# Patient Record
Sex: Female | Born: 1960 | Race: White | Hispanic: No | Marital: Married | Smoking: Never smoker
Health system: Southern US, Community
[De-identification: ages and names within clinical notes are randomized; demographics above are authoritative.]

## PROBLEM LIST (undated history)

## (undated) DIAGNOSIS — E669 Obesity, unspecified: Secondary | ICD-10-CM

## (undated) DIAGNOSIS — Z91030 Bee allergy status: Secondary | ICD-10-CM

## (undated) DIAGNOSIS — M1990 Unspecified osteoarthritis, unspecified site: Secondary | ICD-10-CM

## (undated) DIAGNOSIS — E039 Hypothyroidism, unspecified: Secondary | ICD-10-CM

## (undated) DIAGNOSIS — E559 Vitamin D deficiency, unspecified: Secondary | ICD-10-CM

## (undated) DIAGNOSIS — E0590 Thyrotoxicosis, unspecified without thyrotoxic crisis or storm: Secondary | ICD-10-CM

## (undated) HISTORY — DX: Thyrotoxicosis, unspecified without thyrotoxic crisis or storm: E05.90

## (undated) HISTORY — PX: OTHER SURGICAL HISTORY: SHX169

## (undated) HISTORY — DX: Unspecified osteoarthritis, unspecified site: M19.90

## (undated) HISTORY — DX: Bee allergy status: Z91.030

## (undated) HISTORY — DX: Vitamin D deficiency, unspecified: E55.9

## (undated) HISTORY — DX: Obesity, unspecified: E66.9

---

## 2003-01-08 ENCOUNTER — Encounter: Payer: Self-pay | Admitting: Family Medicine

## 2003-01-08 ENCOUNTER — Encounter: Admission: RE | Admit: 2003-01-08 | Discharge: 2003-01-08 | Payer: Self-pay | Admitting: Family Medicine

## 2003-02-03 ENCOUNTER — Other Ambulatory Visit: Admission: RE | Admit: 2003-02-03 | Discharge: 2003-02-03 | Payer: Self-pay | Admitting: Family Medicine

## 2004-01-25 ENCOUNTER — Encounter: Admission: RE | Admit: 2004-01-25 | Discharge: 2004-01-25 | Payer: Self-pay | Admitting: Family Medicine

## 2004-03-24 ENCOUNTER — Ambulatory Visit (HOSPITAL_COMMUNITY): Admission: RE | Admit: 2004-03-24 | Discharge: 2004-03-24 | Payer: Self-pay | Admitting: Gastroenterology

## 2004-03-24 HISTORY — PX: COLONOSCOPY: SHX174

## 2004-03-24 LAB — HM COLONOSCOPY: HM Colonoscopy: NORMAL

## 2008-04-20 ENCOUNTER — Emergency Department (HOSPITAL_COMMUNITY): Admission: EM | Admit: 2008-04-20 | Discharge: 2008-04-20 | Payer: Self-pay | Admitting: Emergency Medicine

## 2008-04-20 DIAGNOSIS — Z91030 Bee allergy status: Secondary | ICD-10-CM | POA: Insufficient documentation

## 2008-04-20 HISTORY — DX: Bee allergy status: Z91.030

## 2008-08-10 ENCOUNTER — Encounter: Admission: RE | Admit: 2008-08-10 | Discharge: 2008-08-10 | Payer: Self-pay | Admitting: Physician Assistant

## 2010-02-15 LAB — HM PAP SMEAR: HM Pap smear: NORMAL

## 2010-02-22 ENCOUNTER — Encounter: Admission: RE | Admit: 2010-02-22 | Discharge: 2010-02-22 | Payer: Self-pay | Admitting: Family Medicine

## 2010-02-22 LAB — HM MAMMOGRAPHY: HM Mammogram: NORMAL

## 2010-10-09 ENCOUNTER — Encounter: Payer: Self-pay | Admitting: Family Medicine

## 2011-02-03 NOTE — Op Note (Signed)
NAME:  Raven Mendoza, Raven Mendoza                          ACCOUNT NO.:  [id number]   MEDICAL RECORD NO.:  [id number]                   PATIENT TYPE:  AMB   LOCATION:  ENDO                                 FACILITY:  [facility]   PHYSICIAN:  Danise Edge, M.D.                DATE OF BIRTH:  26-Apr-1961   DATE OF PROCEDURE:  03/24/2004  DATE OF DISCHARGE:                                 OPERATIVE REPORT   PROCEDURE:  Screening colonoscopy.   PROCEDURE INDICATION:  [name] is a 50 year old female, born 1960-09-30.  Ms. Schultes's 78 year old brother required surgery for colon  cancer.  She is scheduled to undergo her first screening colonoscopy with  polypectomy to prevent colon cancer.   ENDOSCOPIST:  Danise Edge, M.D.   PREMEDICATION:  1. Versed 10 mg.  2. Demerol 75 mg.   DESCRIPTION OF PROCEDURE:  After obtaining informed consent, [name] was  placed in the left lateral decubitus position.  I administered intravenous  Demerol and intravenous Versed to achieve conscious sedation for the  procedure.  The patient's blood pressure, oxygen saturation, and cardiac  rhythm were monitored throughout the procedure and documented in the medical  record.   Anal inspection and digital rectal exam were normal.  The Olympus adjustable  pediatric colonoscope was introduced into the rectum and advanced to the  cecum.  Colonic preparation for the exam today was excellent.   RECTUM:  Normal.  SIGMOID COLON AND DESCENDING COLON:  Normal.  SPLENIC FLEXURE:  Normal.  TRANSVERSE COLON:  Normal.  HEPATIC FLEXURE:  Normal.  ASCENDING COLON:  Normal.  CECUM AND ILEOCECAL VALVE:  Normal.  DISTAL ILEUM:  Normal.   ASSESSMENT:  Normal screening proctocolonoscopy.   RECOMMENDATIONS:  Repeat colonoscopy in 5 years.                                               Danise Edge, M.D.    MJ/MEDQ  D:  03/24/2004  T:  03/24/2004  Job:  [id number]   cc:   Clydie Braun L. Hal Hope, M.D.  [address]  [address] [place]  [place] [address]  Fax: [phone]

## 2011-10-09 ENCOUNTER — Encounter (HOSPITAL_COMMUNITY): Payer: Self-pay | Admitting: Pharmacy Technician

## 2011-10-18 ENCOUNTER — Encounter (HOSPITAL_COMMUNITY)
Admission: RE | Admit: 2011-10-18 | Discharge: 2011-10-18 | Disposition: A | Discharge status: Home / Self Care | Payer: No Typology Code available for payment source | Source: Ambulatory Visit | Attending: Orthopedic Surgery | Admitting: Orthopedic Surgery

## 2011-10-18 ENCOUNTER — Encounter (HOSPITAL_COMMUNITY): Payer: Self-pay

## 2011-10-18 HISTORY — DX: Hypothyroidism, unspecified: E03.9

## 2011-10-18 HISTORY — DX: Unspecified osteoarthritis, unspecified site: M19.90

## 2011-10-18 LAB — HEPATIC FUNCTION PANEL
ALT: 16 U/L (ref 0–35)
Alkaline Phosphatase: 78 U/L (ref 39–117)
Bilirubin, Direct: <0.1 mg/dL (ref 0.0–0.3)
Total Protein: 7.9 g/dL (ref 6.0–8.3)

## 2011-10-18 LAB — BASIC METABOLIC PANEL
BUN: 18 mg/dL (ref 6–23)
Creatinine, Ser: 0.64 mg/dL (ref 0.50–1.10)
GFR calc Af Amer: >90 mL/min (ref >90)
GFR calc non Af Amer: >90 mL/min (ref >90)
Glucose, Bld: 96 mg/dL (ref 70–99)
Potassium: 3.4 mEq/L — ABNORMAL LOW (ref 3.5–5.1)

## 2011-10-18 LAB — CBC
HCT: 40 % (ref 36.0–46.0)
Hemoglobin: 13.8 g/dL (ref 12.0–15.0)
MCH: 28.8 pg (ref 26.0–34.0)
MCHC: 34.5 g/dL (ref 30.0–36.0)
RDW: 13.2 % (ref 11.5–15.5)

## 2011-10-18 LAB — DIFFERENTIAL
Basophils Absolute: 0.1 10*3/uL (ref 0.0–0.1)
Basophils Relative: 1 % (ref 0–1)
Eosinophils Absolute: 0.1 10*3/uL (ref 0.0–0.7)
Monocytes Absolute: 0.6 10*3/uL (ref 0.1–1.0)
Monocytes Relative: 10 % (ref 3–12)
Neutro Abs: 4 10*3/uL (ref 1.7–7.7)
Neutrophils Relative %: 62 % (ref 43–77)

## 2011-10-18 LAB — URINALYSIS, ROUTINE W REFLEX MICROSCOPIC
Bilirubin Urine: NEGATIVE
Glucose, UA: NEGATIVE mg/dL
Ketones, ur: NEGATIVE mg/dL
Protein, ur: NEGATIVE mg/dL
pH: 5.5 (ref 5.0–8.0)

## 2011-10-18 NOTE — Patient Instructions (Addendum)
[address] Raven Mendoza  10/18/2011   Your procedure is scheduled on:  10-24-2011 Report to [facility] at 1130 AM.  Call this number if you have problems the morning of surgery: [phone]   Remember:   Do not eat food:After Midnight.  May have clear liquids:up to 6 Hours before arrival.clear liquids midnight until 745 am day of surgery, then nothing  Clear liquids include soda, tea, black coffee, apple or grape juice, broth.  Take these medicines the morning of surgery with A SIP OF WATER: restasis eye drop, synthroid, systane eye drop    Do not wear jewelry, make-up.  Do not wear lotions, powders, or perfumes. Do not wear deodorant.  Do not shave 48 hours prior to surgery.  Do not bring valuables to the hospital.  Contacts, dentures or bridgework may not be worn into surgery.  Leave suitcase in the car. After surgery it may be brought to your room.  For patients admitted to the hospital, checkout time is 11:00 AM the day of discharge.    Special Instructions: CHG Shower Use Special Wash: 1/2 bottle night before surgery and 1/2 bottle morning of surgery.neck down avoid private area   Please read over the following fact sheets that you were given: MRSA Information, blood fact sheet [name] [name]  rn [facility] pre op nurse [phone] phone number

## 2011-10-18 NOTE — Progress Notes (Signed)
H&P performed 10/18/2011 Dictation # [id number]

## 2011-10-18 NOTE — Pre-Procedure Instructions (Signed)
ekg 09-13-2011 and medical clearance note dr Shon Hale dixon on chart

## 2011-10-19 NOTE — H&P (Signed)
NAME:  Raven Mendoza, Raven Mendoza                ACCOUNT NO.:  [id number]  MEDICAL RECORD NO.:  [id number]  LOCATION:  PADM                         FACILITY:  [facility]  PHYSICIAN:  Irl Bodie J. Alauna Hayden, P.A.DATE OF BIRTH:  01/12/1961  DATE OF ADMISSION:  10/18/2011 DATE OF DISCHARGE:  10/18/2011                             HISTORY & PHYSICAL   ANTICIPATED DATE OF SURGERY:  October 24, 2011  ADMITTING DIAGNOSIS:  Right hip osteoarthritis.  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  After discussion of treatments, benefits, risks, and options, the patient is now scheduled for total hip arthroplasty of the right hip by anterior approach.  Note that the patient is planning on going home after surgery.  Her medical doctor is Kena Beth Dixon.  She is a candidate for tranexamic acid and dexamethasone and will receive that at preop and she is given her home medicines of aspirin, iron, MiraLax, Colace, and Robaxin.  ALLERGIES:  Drug allergy to PENICILLIN with swelling and rash.  CURRENT MEDICATIONS: 1. Meloxicam 15 mg once a day. 2. Synthroid 125 mcg once a day.  MEDICAL ILLNESSES:  History of hyperthyroidism with radiation therapy and now subsequent hypothyroidism.  PREVIOUS SURGERIES:  C-section x2.  FAMILY HISTORY:  Positive for cancer.  SOCIAL HISTORY:  The patient is married.  She is a dairy farmer.  She does not smoke and does not drink and again is planning on going home after surgery.  REVIEW OF SYSTEMS:  CENTRAL NERVOUS SYSTEM:  Negative for headache, blurred vision, or dizziness.  PULMONARY:  Negative for shortness of breath, PND, and orthopnea.  CARDIOVASCULAR:  No chest pain or palpitation.  GASTROINTESTINAL:  Negative for ulcers and hepatitis. GENITOURINARY:  Negative for urinary tract difficulty.  MUSCULOSKELETAL: Positive in HPI.  PHYSICAL EXAMINATION:  VITAL SIGNS:   Blood pressure 128/73, pulse 76 and regular, respirations 14.  HEENT:  Head, normocephalic.  Nose, patent. Ears, patent.  Pupils equal, round, reactive to light.  Throat without injection. NECK:  Supple without adenopathy.  Carotids 2+ without bruit. CHEST:  Clear auscultation.  No rales or rhonchi.  Respirations 14. HEART:  Regular rate and rhythm at 76 beats per minute with a 1/6 systolic ejection murmur. ABDOMEN:  Soft with active bowel sounds.  No masses or organomegaly. NEUROLOGIC:  The patient is alert and oriented to time, place, and person.  Cranial nerves II through XII grossly intact. EXTREMITIES:  The right hip with limited range of motion with 5 degrees from full extension, further flexion 95 degrees, external rotation of 15 degrees, internal rotation to neutral. NEUROVASCULAR:  Intact.  IMPRESSION:  End-stage osteoarthritis of right hip.  PLAN:  Total hip arthroplasty by anterior approach.     Jazlynne Milliner J. Nickolaos Brallier, P.A.     SJC/MEDQ  D:  10/18/2011  T:  10/19/2011  Job:  [id number] 

## 2011-10-24 ENCOUNTER — Encounter (HOSPITAL_COMMUNITY): Payer: Self-pay | Admitting: *Deleted

## 2011-10-24 ENCOUNTER — Inpatient Hospital Stay (HOSPITAL_COMMUNITY): Payer: No Typology Code available for payment source | Admitting: Anesthesiology

## 2011-10-24 ENCOUNTER — Encounter (HOSPITAL_COMMUNITY)
Admission: RE | Disposition: A | Discharge status: Home Health | Payer: Self-pay | Source: Ambulatory Visit | Attending: Orthopedic Surgery

## 2011-10-24 ENCOUNTER — Inpatient Hospital Stay (HOSPITAL_COMMUNITY): Payer: No Typology Code available for payment source

## 2011-10-24 ENCOUNTER — Encounter (HOSPITAL_COMMUNITY): Payer: Self-pay | Admitting: Anesthesiology

## 2011-10-24 ENCOUNTER — Inpatient Hospital Stay (HOSPITAL_COMMUNITY)
Admission: RE | Admit: 2011-10-24 | Discharge: 2011-10-26 | DRG: 470 | Disposition: A | Discharge status: Home Health | Payer: No Typology Code available for payment source | Source: Ambulatory Visit | Attending: Orthopedic Surgery | Admitting: Orthopedic Surgery

## 2011-10-24 DIAGNOSIS — E039 Hypothyroidism, unspecified: Secondary | ICD-10-CM | POA: Diagnosis present

## 2011-10-24 DIAGNOSIS — M169 Osteoarthritis of hip, unspecified: Principal | ICD-10-CM | POA: Diagnosis present

## 2011-10-24 DIAGNOSIS — Z01812 Encounter for preprocedural laboratory examination: Secondary | ICD-10-CM

## 2011-10-24 DIAGNOSIS — Z96649 Presence of unspecified artificial hip joint: Secondary | ICD-10-CM

## 2011-10-24 DIAGNOSIS — M1610 Unilateral primary osteoarthritis, unspecified hip: Principal | ICD-10-CM | POA: Diagnosis present

## 2011-10-24 HISTORY — PX: TOTAL HIP ARTHROPLASTY: SHX124

## 2011-10-24 LAB — TYPE AND SCREEN: ABO/RH(D): O POS

## 2011-10-24 SURGERY — ARTHROPLASTY, HIP, TOTAL, ANTERIOR APPROACH
Anesthesia: General | Site: Hip | Laterality: Right | Wound class: Clean

## 2011-10-24 MED ORDER — PROMETHAZINE HCL 25 MG/ML IJ SOLN: 6.2500 mg | INTRAMUSCULAR | Status: DC | PRN

## 2011-10-24 MED ORDER — LEVOTHYROXINE SODIUM 125 MCG PO TABS
125.0000 ug | ORAL_TABLET | Freq: Every day | ORAL | Status: DC
  Administered 2011-10-25 – 2011-10-26 (×2): 125 ug via ORAL
  Filled 2011-10-24 (×3): qty 1

## 2011-10-24 MED ORDER — MENTHOL 3 MG MT LOZG: 1.0000 | LOZENGE | OROMUCOSAL | Status: DC | PRN

## 2011-10-24 MED ORDER — FLEET ENEMA 7-19 GM/118ML RE ENEM: 1.0000 | ENEMA | Freq: Once | RECTAL | Status: AC | PRN

## 2011-10-24 MED ORDER — POLYETHYLENE GLYCOL 3350 17 G PO PACK
17.0000 g | PACK | Freq: Two times a day (BID) | ORAL | Status: DC
  Administered 2011-10-25 – 2011-10-26 (×3): 17 g via ORAL
  Filled 2011-10-24 (×5): qty 1

## 2011-10-24 MED ORDER — CYCLOSPORINE 0.05 % OP EMUL
1.0000 [drp] | Freq: Two times a day (BID) | OPHTHALMIC | Status: DC
  Administered 2011-10-25 (×2): 1 [drp] via OPHTHALMIC
  Filled 2011-10-24 (×5): qty 1

## 2011-10-24 MED ORDER — CLINDAMYCIN PHOSPHATE 600 MG/50ML IV SOLN
600.0000 mg | INTRAVENOUS | Status: AC
  Administered 2011-10-24: 600 mg via INTRAVENOUS

## 2011-10-24 MED ORDER — HYDROMORPHONE HCL PF 1 MG/ML IJ SOLN
0.5000 mg | INTRAMUSCULAR | Status: DC | PRN
  Administered 2011-10-25: 0.5 mg via INTRAVENOUS
  Filled 2011-10-24: qty 1

## 2011-10-24 MED ORDER — FERROUS SULFATE 325 (65 FE) MG PO TABS
325.0000 mg | ORAL_TABLET | Freq: Three times a day (TID) | ORAL | Status: DC
  Administered 2011-10-25 – 2011-10-26 (×5): 325 mg via ORAL
  Filled 2011-10-24 (×7): qty 1

## 2011-10-24 MED ORDER — FENTANYL CITRATE 0.05 MG/ML IJ SOLN
INTRAMUSCULAR | Status: DC | PRN
  Administered 2011-10-24 (×5): 50 ug via INTRAVENOUS

## 2011-10-24 MED ORDER — PROPOFOL 10 MG/ML IV EMUL
INTRAVENOUS | Status: DC | PRN
  Administered 2011-10-24: 175 mg via INTRAVENOUS

## 2011-10-24 MED ORDER — PHENOL 1.4 % MT LIQD: 1.0000 | OROMUCOSAL | Status: DC | PRN

## 2011-10-24 MED ORDER — GLYCOPYRROLATE 0.2 MG/ML IJ SOLN
INTRAMUSCULAR | Status: DC | PRN
  Administered 2011-10-24: .4 mg via INTRAVENOUS

## 2011-10-24 MED ORDER — SUCCINYLCHOLINE CHLORIDE 20 MG/ML IJ SOLN
INTRAMUSCULAR | Status: DC | PRN
  Administered 2011-10-24: 100 mg via INTRAVENOUS

## 2011-10-24 MED ORDER — 0.9 % SODIUM CHLORIDE (POUR BTL) OPTIME
TOPICAL | Status: DC | PRN
  Administered 2011-10-24: 1000 mL

## 2011-10-24 MED ORDER — METOCLOPRAMIDE HCL 10 MG PO TABS: 5.0000 mg | ORAL_TABLET | Freq: Three times a day (TID) | ORAL | Status: DC | PRN

## 2011-10-24 MED ORDER — METHOCARBAMOL 500 MG PO TABS
500.0000 mg | ORAL_TABLET | Freq: Four times a day (QID) | ORAL | Status: DC | PRN
  Administered 2011-10-25 – 2011-10-26 (×5): 500 mg via ORAL
  Filled 2011-10-24 (×5): qty 1

## 2011-10-24 MED ORDER — METOCLOPRAMIDE HCL 5 MG/ML IJ SOLN: 5.0000 mg | Freq: Three times a day (TID) | INTRAMUSCULAR | Status: DC | PRN

## 2011-10-24 MED ORDER — TRANEXAMIC ACID 100 MG/ML IV SOLN
1250.0000 mg | INTRAVENOUS | Status: AC
  Administered 2011-10-24: 1250 mg via INTRAVENOUS
  Filled 2011-10-24: qty 12.5

## 2011-10-24 MED ORDER — DROPERIDOL 2.5 MG/ML IJ SOLN
INTRAMUSCULAR | Status: DC | PRN
  Administered 2011-10-24: .5 mg via INTRAVENOUS

## 2011-10-24 MED ORDER — ACETAMINOPHEN 650 MG RE SUPP: 650.0000 mg | Freq: Four times a day (QID) | RECTAL | Status: DC | PRN

## 2011-10-24 MED ORDER — CISATRACURIUM BESYLATE 2 MG/ML IV SOLN
INTRAVENOUS | Status: DC | PRN
  Administered 2011-10-24: 4 mg via INTRAVENOUS
  Administered 2011-10-24: 8 mg via INTRAVENOUS

## 2011-10-24 MED ORDER — ACETAMINOPHEN 10 MG/ML IV SOLN
INTRAVENOUS | Status: DC | PRN
  Administered 2011-10-24: 1000 mg via INTRAVENOUS

## 2011-10-24 MED ORDER — NEOSTIGMINE METHYLSULFATE 1 MG/ML IJ SOLN
INTRAMUSCULAR | Status: DC | PRN
  Administered 2011-10-24: 3 mg via INTRAVENOUS

## 2011-10-24 MED ORDER — METHOCARBAMOL 100 MG/ML IJ SOLN
500.0000 mg | Freq: Four times a day (QID) | INTRAVENOUS | Status: DC | PRN
  Administered 2011-10-24 – 2011-10-25 (×2): 500 mg via INTRAVENOUS
  Filled 2011-10-24 (×3): qty 5

## 2011-10-24 MED ORDER — VITAMIN D3 25 MCG (1000 UNIT) PO TABS
2000.0000 [IU] | ORAL_TABLET | Freq: Every day | ORAL | Status: DC
  Administered 2011-10-25 – 2011-10-26 (×2): 2000 [IU] via ORAL
  Filled 2011-10-24 (×3): qty 2

## 2011-10-24 MED ORDER — ACETAMINOPHEN 325 MG PO TABS: 650.0000 mg | ORAL_TABLET | Freq: Four times a day (QID) | ORAL | Status: DC | PRN

## 2011-10-24 MED ORDER — POLYETHYL GLYCOL-PROPYL GLYCOL 0.4-0.3 % OP SOLN: 1.0000 [drp] | Freq: Two times a day (BID) | OPHTHALMIC | Status: DC | PRN

## 2011-10-24 MED ORDER — MIDAZOLAM HCL 5 MG/5ML IJ SOLN
INTRAMUSCULAR | Status: DC | PRN
  Administered 2011-10-24 (×2): 1 mg via INTRAVENOUS

## 2011-10-24 MED ORDER — DEXAMETHASONE SODIUM PHOSPHATE 10 MG/ML IJ SOLN
10.0000 mg | Freq: Once | INTRAMUSCULAR | Status: AC
  Administered 2011-10-25: 10 mg via INTRAVENOUS
  Filled 2011-10-24: qty 1

## 2011-10-24 MED ORDER — ONDANSETRON HCL 4 MG PO TABS: 4.0000 mg | ORAL_TABLET | Freq: Four times a day (QID) | ORAL | Status: DC | PRN

## 2011-10-24 MED ORDER — MELOXICAM 15 MG PO TABS
15.0000 mg | ORAL_TABLET | Freq: Every day | ORAL | Status: DC
  Administered 2011-10-25 – 2011-10-26 (×2): 15 mg via ORAL
  Filled 2011-10-24 (×3): qty 1

## 2011-10-24 MED ORDER — DIPHENHYDRAMINE HCL 12.5 MG/5ML PO ELIX: 12.5000 mg | ORAL_SOLUTION | ORAL | Status: DC | PRN

## 2011-10-24 MED ORDER — KETAMINE HCL 10 MG/ML IJ SOLN
INTRAMUSCULAR | Status: DC | PRN
  Administered 2011-10-24 (×4): 5 mg via INTRAVENOUS

## 2011-10-24 MED ORDER — HYDROCODONE-ACETAMINOPHEN 7.5-325 MG PO TABS
1.0000 | ORAL_TABLET | ORAL | Status: DC | PRN
  Administered 2011-10-25 – 2011-10-26 (×5): 1 via ORAL
  Filled 2011-10-24 (×6): qty 1

## 2011-10-24 MED ORDER — RIVAROXABAN 10 MG PO TABS
10.0000 mg | ORAL_TABLET | Freq: Every day | ORAL | Status: DC
  Administered 2011-10-25 – 2011-10-26 (×2): 10 mg via ORAL
  Filled 2011-10-24 (×2): qty 1

## 2011-10-24 MED ORDER — SODIUM CHLORIDE 0.9 % IV SOLN
INTRAVENOUS | Status: DC
  Administered 2011-10-24: 23:00:00 via INTRAVENOUS
  Filled 2011-10-24 (×7): qty 1000

## 2011-10-24 MED ORDER — DOCUSATE SODIUM 100 MG PO CAPS
100.0000 mg | ORAL_CAPSULE | Freq: Two times a day (BID) | ORAL | Status: DC
  Administered 2011-10-25 – 2011-10-26 (×3): 100 mg via ORAL
  Filled 2011-10-24 (×5): qty 1

## 2011-10-24 MED ORDER — ONDANSETRON HCL 4 MG/2ML IJ SOLN
INTRAMUSCULAR | Status: DC | PRN
  Administered 2011-10-24 (×2): 2 mg via INTRAVENOUS

## 2011-10-24 MED ORDER — HYDROMORPHONE HCL PF 1 MG/ML IJ SOLN
INTRAMUSCULAR | Status: DC | PRN
  Administered 2011-10-24 (×2): 0.5 mg via INTRAVENOUS
  Administered 2011-10-24: .5 mg via INTRAVENOUS

## 2011-10-24 MED ORDER — CLINDAMYCIN PHOSPHATE 600 MG/50ML IV SOLN
600.0000 mg | Freq: Four times a day (QID) | INTRAVENOUS | Status: AC
  Administered 2011-10-24 – 2011-10-25 (×3): 600 mg via INTRAVENOUS
  Filled 2011-10-24 (×3): qty 50

## 2011-10-24 MED ORDER — LACTATED RINGERS IV SOLN
INTRAVENOUS | Status: DC
  Administered 2011-10-24: 1000 mL via INTRAVENOUS
  Administered 2011-10-24: 18:00:00 via INTRAVENOUS

## 2011-10-24 MED ORDER — POLYVINYL ALCOHOL 1.4 % OP SOLN
1.0000 [drp] | Freq: Two times a day (BID) | OPHTHALMIC | Status: DC | PRN
  Filled 2011-10-24: qty 15

## 2011-10-24 MED ORDER — HYDROMORPHONE HCL PF 1 MG/ML IJ SOLN
0.2500 mg | INTRAMUSCULAR | Status: DC | PRN
  Administered 2011-10-24: 0.5 mg via INTRAVENOUS

## 2011-10-24 MED ORDER — ALUM & MAG HYDROXIDE-SIMETH 200-200-20 MG/5ML PO SUSP
30.0000 mL | ORAL | Status: DC | PRN
  Filled 2011-10-24: qty 30

## 2011-10-24 MED ORDER — LACTATED RINGERS IV SOLN
INTRAVENOUS | Status: DC | PRN
  Administered 2011-10-24 (×2): via INTRAVENOUS

## 2011-10-24 MED ORDER — DEXAMETHASONE SODIUM PHOSPHATE 10 MG/ML IJ SOLN: 10.0000 mg | Freq: Once | INTRAMUSCULAR | Status: DC

## 2011-10-24 MED ORDER — ONDANSETRON HCL 4 MG/2ML IJ SOLN
4.0000 mg | Freq: Four times a day (QID) | INTRAMUSCULAR | Status: DC | PRN
  Administered 2011-10-24 – 2011-10-25 (×2): 4 mg via INTRAVENOUS
  Filled 2011-10-24 (×2): qty 2

## 2011-10-24 MED ORDER — LIDOCAINE HCL (CARDIAC) 20 MG/ML IV SOLN
INTRAVENOUS | Status: DC | PRN
  Administered 2011-10-24: 20 mg via INTRAVENOUS

## 2011-10-24 SURGICAL SUPPLY — 39 items
BAG ZIPLOCK 12X15 (MISCELLANEOUS) ×4 IMPLANT
BLADE SAW SGTL 18X1.27X75 (BLADE) ×2 IMPLANT
CELLS DAT CNTRL 66122 CELL SVR (MISCELLANEOUS) ×1 IMPLANT
CLOTH BEACON ORANGE TIMEOUT ST (SAFETY) ×2 IMPLANT
DERMABOND ADVANCED (GAUZE/BANDAGES/DRESSINGS) ×1
DERMABOND ADVANCED .7 DNX12 (GAUZE/BANDAGES/DRESSINGS) ×1 IMPLANT
DRAPE C-ARM 42X72 X-RAY (DRAPES) ×2 IMPLANT
DRAPE STERI IOBAN 125X83 (DRAPES) ×2 IMPLANT
DRAPE U-SHAPE 47X51 STRL (DRAPES) ×6 IMPLANT
DRSG AQUACEL AG ADV 3.5X 6 (GAUZE/BANDAGES/DRESSINGS) ×2 IMPLANT
DRSG AQUACEL AG ADV 3.5X10 (GAUZE/BANDAGES/DRESSINGS) ×2 IMPLANT
DRSG TEGADERM 4X4.75 (GAUZE/BANDAGES/DRESSINGS) ×2 IMPLANT
DURAPREP 26ML APPLICATOR (WOUND CARE) ×2 IMPLANT
ELECT BLADE TIP CTD 4 INCH (ELECTRODE) ×2 IMPLANT
ELECT REM PT RETURN 9FT ADLT (ELECTROSURGICAL) ×2
ELECTRODE REM PT RTRN 9FT ADLT (ELECTROSURGICAL) ×1 IMPLANT
EVACUATOR 1/8 PVC DRAIN (DRAIN) IMPLANT
FACESHIELD LNG OPTICON STERILE (SAFETY) ×8 IMPLANT
GAUZE SPONGE 2X2 8PLY STRL LF (GAUZE/BANDAGES/DRESSINGS) ×1 IMPLANT
GLOVE BIOGEL PI IND STRL 7.5 (GLOVE) ×1 IMPLANT
GLOVE BIOGEL PI IND STRL 8 (GLOVE) ×1 IMPLANT
GLOVE BIOGEL PI INDICATOR 7.5 (GLOVE) ×1
GLOVE BIOGEL PI INDICATOR 8 (GLOVE) ×1
GLOVE ECLIPSE 8.0 STRL XLNG CF (GLOVE) ×2 IMPLANT
GLOVE ORTHO TXT STRL SZ7.5 (GLOVE) ×4 IMPLANT
GOWN BRE IMP PREV XXLGXLNG (GOWN DISPOSABLE) ×2 IMPLANT
GOWN STRL NON-REIN LRG LVL3 (GOWN DISPOSABLE) ×2 IMPLANT
KIT BASIN OR (CUSTOM PROCEDURE TRAY) ×2 IMPLANT
PACK TOTAL JOINT (CUSTOM PROCEDURE TRAY) ×2 IMPLANT
PADDING CAST COTTON 6X4 STRL (CAST SUPPLIES) ×2 IMPLANT
RTRCTR WOUND ALEXIS 18CM MED (MISCELLANEOUS) ×2
SPONGE GAUZE 2X2 STER 10/PKG (GAUZE/BANDAGES/DRESSINGS) ×1
SUCTION FRAZIER 12FR DISP (SUCTIONS) ×2 IMPLANT
SUT MNCRL AB 4-0 PS2 18 (SUTURE) ×2 IMPLANT
SUT VIC AB 1 CT1 36 (SUTURE) ×8 IMPLANT
SUT VIC AB 2-0 CT1 27 (SUTURE) ×2
SUT VIC AB 2-0 CT1 TAPERPNT 27 (SUTURE) ×2 IMPLANT
TOWEL OR 17X26 10 PK STRL BLUE (TOWEL DISPOSABLE) ×4 IMPLANT
TRAY FOLEY CATH 14FRSI W/METER (CATHETERS) ×2 IMPLANT

## 2011-10-24 NOTE — Op Note (Signed)
NAME:  Raven Mendoza                ACCOUNT NO.: [id number]      MEDICAL RECORD NO.: [id number]      FACILITY:  [facility]      PHYSICIAN:  Armstead Heiland D  DATE OF BIRTH:  [birth year]     DATE OF PROCEDURE:  10/24/2011                                 OPERATIVE REPORT         PREOPERATIVE DIAGNOSIS: Right  hip osteoarthritis.      POSTOPERATIVE DIAGNOSIS:  Right hip osteoarthritis.      PROCEDURE:  Right total hip replacement through an anterior approach   utilizing DePuy THR system, component size 50 pinnacle cup, a size 32+4 neutral   Altrex liner, a size 1 Hi Tri Lock stem with a 32+1 delta ceramic   ball.      SURGEON:  Madlyn Frankel. Charlann Boxer, M.D.      ASSISTANT:  Lanney Gins, PA      ANESTHESIA:  General.      SPECIMENS:  None.      COMPLICATIONS:  None.      BLOOD LOSS:  300 cc     DRAINS:  One Hemovac.      INDICATION OF THE PROCEDURE:  Raven Mendoza is a 51 y.o. female who had   presented to office for evaluation of right hip pain.  Radiographs revealed   progressive degenerative changes with bone-on-bone   articulation to the  hip joint.  The patient had painful limited range of   motion significantly affecting their overall quality of life.  The patient was failing to    respond to conservative measures, and at this point was ready   to proceed with more definitive measures.  The patient has noted progressive   degenerative changes in his hip, progressive problems and dysfunction   with regarding the hip prior to surgery.  Consent was obtained for   benefit of pain relief.  Specific risk of infection, DVT, component   failure, dislocation, need for revision surgery, as well discussion of   the anterior versus posterior approach were reviewed.  Consent was   obtained for benefit of anterior pain relief through an anterior   approach.      PROCEDURE IN DETAIL:  The patient was brought to operative theater.   Once adequate anesthesia, preoperative antibiotics, 600mg   Clindamycin administered.   The patient was positioned supine on the OSI Hanna table.  Once adequate   padding of boney process was carried out, we had predraped out the hip, and  used fluoroscopy to confirm orientation of the pelvis and position.      The right hip was then prepped and draped from proximal iliac crest to   mid thigh with shower curtain technique.      Time-out was performed identifying the patient, planned procedure, and   extremity.     An incision was then made 2 cm distal and lateral to the   anterior superior iliac spine extending over the orientation of the   tensor fascia lata muscle and sharp dissection was carried down to the   fascia of the muscle and protractor placed in the soft tissues.      The fascia was then incised.  The muscle belly was identified and swept  laterally and retractor placed along the superior neck.  Following   cauterization of the circumflex vessels and removing some pericapsular   fat, a second cobra retractor was placed on the inferior neck.  A third   retractor was placed on the anterior acetabulum after elevating the   anterior rectus.  A L-capsulotomy was along the line of the   superior neck to the trochanteric fossa, then extended proximally and   distally.  Tag sutures were placed and the retractors were then placed   intracapsular.  We then identified the trochanteric fossa and   orientation of my neck cut, confirmed this radiographically   and then made a neck osteotomy with the femur on traction.  The femoral   head was removed without difficulty or complication.  Traction was let   off and retractors were placed posterior and anterior around the   acetabulum.      The labrum and foveal tissue were debrided.  I began reaming with a 44mm   reamer and reamed up to 49mm reamer with good bony bed preparation and a 50   cup was chosen.  The final 50mm Pinnacle cup was then impacted under fluoroscopy  to confirm the depth of  penetration and orientation with respect to   abduction.  A screw was placed followed by the hole eliminator.  The final   32+4 Altrex liner was impacted with good visualized rim fit.  The cup was positioned anatomically within the acetabular portion of the pelvis.      At this point, the femur was rolled at 80 degrees.  Further capsule was   released off the inferior aspect of the femoral neck.  I then   released the superior capsule proximally.  The hook was placed laterally   along the femur and elevated manually and held in position with the bed   hook.  The leg was then extended and adducted with the leg rolled to 100   degrees of external rotation.  Once the proximal femur was fully   exposed, I used a box osteotome to set orientation.  I then began   broaching with the starting chili pepper broach and passed this by hand and then broached up to only the 1 broach.  With the size 1 broach in place I chose a high offset neck and did a trial reduction.  The offset was appropriate, leg lengths   appeared to be equal, confirmed radiographically.   Given these findings, I went ahead and dislocated the hip, repositioned all   retractors and positioned the right hip in the extended and abducted position.  The final 1 Hi  Tri Lock stem was   chosen and it was impacted down to the level of neck cut.  Based on this   and the trial reduction, a 32+1 delta ceramic ball was chosen and   impacted onto a clean and dry trunnion, and the hip was reduced.  The   hip had been irrigated throughout the case again at this point.  I did   reapproximate the superior capsular leaflet to the anterior leaflet   using #1 Vicryl, placed a medium Hemovac drain deep.  The fascia of the   tensor fascia lata muscle was then reapproximated using #1 Vicryl.  The   remaining wound was closed with 2-0 Vicryl and running 4-0 Monocryl.   The hip was cleaned, dried, and dressed sterilely using Dermabond and   Aquacel  dressing.  Drain site dressed  separately.  She was then brought   to recovery room in stable condition tolerating the procedure well.    Lanney Gins, PA-C was present for the entirety of the case involved from   preoperative positioning, perioperative retractor management, general   facilitation of the case, as well as primary wound closure as assistant.            Madlyn Frankel Charlann Boxer, M.D.            MDO/MEDQ  D:  07/11/2011  T:  07/11/2011  Job:  [id number]      Electronically Signed by Durene Romans M.D. on 07/17/2011 09:15:38 AM

## 2011-10-24 NOTE — Interval H&P Note (Signed)
History and Physical Interval Note:  10/24/2011 12:11 PM  Raven Mendoza  has presented today for surgery, with the diagnosis of right hip osteoarthritis  The various methods of treatment have been discussed with the patient and family. After consideration of risks, benefits and other options for treatment, the patient has consented to  Procedure(s): RIGHT TOTAL HIP ARTHROPLASTY ANTERIOR APPROACH as a surgical intervention .  The patients' history has been reviewed, patient examined, no change in status, stable for surgery.  I have reviewed the patients' chart and labs.  Questions were answered to the patient's satisfaction.     Shelda Pal

## 2011-10-24 NOTE — Transfer of Care (Signed)
Immediate Anesthesia Transfer of Care Note  Patient: Raven Mendoza  Procedure(s) Performed:  TOTAL HIP ARTHROPLASTY ANTERIOR APPROACH  Patient Location: PACU  Anesthesia Type: General  Level of Consciousness: sedated  Airway & Oxygen Therapy: Patient Spontanous Breathing and Patient connected to face mask oxygen  Post-op Assessment: Report given to PACU RN and Post -op Vital signs reviewed and stable  Post vital signs: Reviewed and stable  Complications: No apparent anesthesia complications

## 2011-10-24 NOTE — Anesthesia Postprocedure Evaluation (Signed)
  Anesthesia Post-op Note  Patient: Raven Mendoza  Procedure(s) Performed:  TOTAL HIP ARTHROPLASTY ANTERIOR APPROACH  Patient Location: PACU  Anesthesia Type: General  Level of Consciousness: oriented and sedated  Airway and Oxygen Therapy: Patient Spontanous Breathing and Patient connected to nasal cannula oxygen  Post-op Pain: mild  Post-op Assessment: Post-op Vital signs reviewed, Patient's Cardiovascular Status Stable, Respiratory Function Stable and Patent Airway  Post-op Vital Signs: stable  Complications: No apparent anesthesia complications

## 2011-10-24 NOTE — Anesthesia Preprocedure Evaluation (Signed)
Anesthesia Evaluation  Patient identified by MRN, date of birth, ID band Patient awake    Reviewed: Allergy & Precautions, H&P , NPO status , Patient's Chart, lab work & pertinent test results, reviewed documented beta blocker date and time   Airway Mallampati: II      Dental  (+) Teeth Intact and Dental Advisory Given   Pulmonary neg pulmonary ROS,  clear to auscultation        Cardiovascular neg cardio ROS Regular Normal Denies cardiac symptoms Given clearance   Neuro/Psych Negative Neurological ROS  Negative Psych ROS   GI/Hepatic negative GI ROS, Neg liver ROS,   Endo/Other  Thyroid replacement  Renal/GU negative Renal ROS  Genitourinary negative   Musculoskeletal negative musculoskeletal ROS (+)   Abdominal   Peds negative pediatric ROS (+)  Hematology negative hematology ROS (+)   Anesthesia Other Findings   Reproductive/Obstetrics negative OB ROS                           Anesthesia Physical Anesthesia Plan  ASA: II  Anesthesia Plan: General   Post-op Pain Management:    Induction: Intravenous  Airway Management Planned: Oral ETT  Additional Equipment:   Intra-op Plan:   Post-operative Plan: Extubation in OR  Informed Consent: I have reviewed the patients History and Physical, chart, labs and discussed the procedure including the risks, benefits and alternatives for the proposed anesthesia with the patient or authorized representative who has indicated his/her understanding and acceptance.   Dental advisory given  Plan Discussed with: CRNA and Surgeon  Anesthesia Plan Comments:         Anesthesia Quick Evaluation

## 2011-10-24 NOTE — H&P (View-Only) (Signed)
NAMEPRESSLEY, Raven Mendoza                ACCOUNT NO.:  [id number]  MEDICAL RECORD NO.:  [id number]  LOCATION:  PADM                         FACILITY:  [facility]  PHYSICIAN:  Jaquelyn Bitter. Jezreel Sisk, P.A.DATE OF BIRTH:  04-27-1961  DATE OF ADMISSION:  10/18/2011 DATE OF DISCHARGE:  10/18/2011                             HISTORY & PHYSICAL   ANTICIPATED DATE OF SURGERY:  October 24, 2011  ADMITTING DIAGNOSIS:  Right hip osteoarthritis.  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  After discussion of treatments, benefits, risks, and options, the patient is now scheduled for total hip arthroplasty of the right hip by anterior approach.  Note that the patient is planning on going home after surgery.  Her medical doctor is [facility].  She is a candidate for tranexamic acid and dexamethasone and will receive that at preop and she is given her home medicines of aspirin, iron, MiraLax, Colace, and Robaxin.  ALLERGIES:  Drug allergy to PENICILLIN with swelling and rash.  CURRENT MEDICATIONS: 1. Meloxicam 15 mg once a day. 2. Synthroid 125 mcg once a day.  MEDICAL ILLNESSES:  History of hyperthyroidism with radiation therapy and now subsequent hypothyroidism.  PREVIOUS SURGERIES:  C-section x2.  FAMILY HISTORY:  Positive for cancer.  SOCIAL HISTORY:  The patient is married.  She is a Engineer, maintenance.  She does not smoke and does not drink and again is planning on going home after surgery.  REVIEW OF SYSTEMS:  CENTRAL NERVOUS SYSTEM:  Negative for headache, blurred vision, or dizziness.  PULMONARY:  Negative for shortness of breath, PND, and orthopnea.  CARDIOVASCULAR:  No chest pain or palpitation.  GASTROINTESTINAL:  Negative for ulcers and hepatitis. GENITOURINARY:  Negative for urinary tract difficulty.  MUSCULOSKELETAL: Positive in HPI.  PHYSICAL EXAMINATION:  VITAL SIGNS:   Blood pressure 128/73, pulse 76 and regular, respirations 14.  HEENT:  Head, normocephalic.  Nose, patent. Ears, patent.  Pupils equal, round, reactive to light.  Throat without injection. NECK:  Supple without adenopathy.  Carotids 2+ without bruit. CHEST:  Clear auscultation.  No rales or rhonchi.  Respirations 14. HEART:  Regular rate and rhythm at 76 beats per minute with a 1/6 systolic ejection murmur. ABDOMEN:  Soft with active bowel sounds.  No masses or organomegaly. NEUROLOGIC:  The patient is alert and oriented to time, place, and person.  Cranial nerves II through XII grossly intact. EXTREMITIES:  The right hip with limited range of motion with 5 degrees from full extension, further flexion 95 degrees, external rotation of 15 degrees, internal rotation to neutral. NEUROVASCULAR:  Intact.  IMPRESSION:  End-stage osteoarthritis of right hip.  PLAN:  Total hip arthroplasty by anterior approach.     Jaquelyn Bitter. Ernestene Kiel.     SJC/MEDQ  D:  10/18/2011  T:  10/19/2011  Job:  [id number]

## 2011-10-24 NOTE — Progress Notes (Signed)
Pt arrived to unit on bed. Pt very drowsy, awakens on calling, falls right back to sleep. Answers all questions appropriately, A&Ox4. Dsg to hip c/d/i. Hemovac charged and draining, foley to gravity and draining cl/y urine. POC discussed w/ family at bedside. Pt and family oriented to callbell and environment.

## 2011-10-25 LAB — CBC
HCT: 36.2 % (ref 36.0–46.0)
Hemoglobin: 12.3 g/dL (ref 12.0–15.0)
MCV: 86 fL (ref 78.0–100.0)
Platelets: 239 10*3/uL (ref 150–400)

## 2011-10-25 LAB — BASIC METABOLIC PANEL
CO2: 24 mEq/L (ref 19–32)
Calcium: 8.9 mg/dL (ref 8.4–10.5)
Chloride: 101 mEq/L (ref 96–112)
GFR calc Af Amer: >90 mL/min (ref >90)
Sodium: 135 mEq/L (ref 135–145)

## 2011-10-25 NOTE — Progress Notes (Signed)
Subjective: 1 Day Post-Op Procedure(s) (LRB): TOTAL HIP ARTHROPLASTY ANTERIOR APPROACH (Right)   Patient reports pain as mild. No events.    Objective:   VITALS:   Filed Vitals:   10/25/11 0628  BP: 105/69  Pulse: 66  Temp: 99 F (37.2 C)  Resp: 18    Neurovascular intact Dorsiflexion/Plantar flexion intact Incision: dressing C/D/I No cellulitis present Compartment soft  LABS  Basename 10/25/11 0420  HGB 12.3  HCT 36.2  WBC 11.6*  PLT 239     Basename 10/25/11 0420  NA 135  K 4.1  BUN 10  CREATININE 0.59  GLUCOSE 121*     Assessment/Plan: 1 Day Post-Op Procedure(s) (LRB): TOTAL HIP ARTHROPLASTY ANTERIOR APPROACH (Right)   Foley cath d/c'ed HV drain d/c'ed Advance diet Up with therapy D/C IV fluids Plan for discharge tomorrow Discharge home with home health if continues to do well   Anastasio Auerbach. Riordan Walle   PAC  10/25/2011, 8:14 AM

## 2011-10-25 NOTE — Progress Notes (Signed)
Physical Therapy Treatment Patient Details Name: Raven Mendoza MRN: [id number] DOB: [birth year] Today's Date: 10/25/2011  PT Assessment/Plan  PT - Assessment/Plan Comments on Treatment Session: Marked improvement in activity tolerance. PT Plan: Discharge plan remains appropriate PT Frequency: 7X/week Recommendations for Other Services: OT consult Follow Up Recommendations: Home health PT Equipment Recommended: None recommended by PT PT Goals  Acute Rehab PT Goals PT Goal Formulation: With patient Time For Goal Achievement: 7 days Pt will go Supine/Side to Sit: with supervision Pt will go Sit to Supine/Side: with supervision Pt will go Sit to Stand: with supervision PT Goal: Sit to Stand - Progress: Progressing toward goal Pt will go Stand to Sit: with supervision PT Goal: Stand to Sit - Progress: Progressing toward goal Pt will Ambulate: 51 - 150 feet;with supervision;with rolling walker;with standard walker PT Goal: Ambulate - Progress: Progressing toward goal  PT Treatment Precautions/Restrictions  Restrictions Weight Bearing Restrictions: No Other Position/Activity Restrictions: WBAT Mobility (including Balance) Transfers Sit to Stand: 4: Min assist;With upper extremity assist;With armrests;From chair/3-in-1 Sit to Stand Details (indicate cue type and reason): cues for use of UEs and for LE position Stand to Sit: 4: Min assist;With upper extremity assist;With armrests;To chair/3-in-1 Stand to Sit Details: cues for use of UEs and for LE position Ambulation/Gait Ambulation/Gait Assistance: 4: Min assist;5: Supervision Ambulation/Gait Assistance Details (indicate cue type and reason): cues for posture, sequence, stride length and position from RW Ambulation Distance (Feet): 168 Feet Assistive device: Rolling walker Gait Pattern: Step-to pattern    Exercise    End of Session PT - End of Session Activity Tolerance: Patient tolerated treatment well Patient left: in  chair;with call bell in reach Nurse Communication: Mobility status for transfers;Mobility status for ambulation General Behavior During Session: [name] for tasks performed Cognition: [name] for tasks performed  Raven Mendoza 10/25/2011, 3:55 PM

## 2011-10-25 NOTE — Evaluation (Signed)
Physical Therapy Evaluation Patient Details Name: Raven Mendoza MRN: [id number] DOB: [birth year] Today's Date: 10/25/2011  Problem List:  Patient Active Problem List  Diagnoses  . S/P right hip replacement    Past Medical History:  Past Medical History  Diagnosis Date  . Hypothyroidism   . Arthritis    Past Surgical History:  Past Surgical History  Procedure Date  . Radioactive iodine tx for thyroid 4 yrs ago,and  5 yrs ago  . C sections 1987 and 1990    PT Assessment/Plan/Recommendation PT Assessment Clinical Impression Statement: Pt with R THR presents with decreased R LE strength/ROM and ltd L knee ROM as well as decreased functional mobiltiy.  Pt will benefit from skilled PT intervention to maximize IND for d/c home. PT Recommendation/Assessment: Patient will need skilled PT in the acute care venue PT Problem List: Decreased strength;Decreased range of motion;Decreased activity tolerance;Decreased balance;Decreased mobility;Decreased knowledge of use of DME;Pain PT Therapy Diagnosis : Difficulty walking PT Plan PT Frequency: 7X/week PT Recommendation Recommendations for Other Services: OT consult Follow Up Recommendations: Home health PT Equipment Recommended: None recommended by PT PT Goals  Acute Rehab PT Goals PT Goal Formulation: With patient Time For Goal Achievement: 7 days Pt will go Supine/Side to Sit: with supervision PT Goal: Supine/Side to Sit - Progress: Goal set today Pt will go Sit to Supine/Side: with supervision PT Goal: Sit to Supine/Side - Progress: Goal set today Pt will go Sit to Stand: with supervision PT Goal: Sit to Stand - Progress: Goal set today Pt will go Stand to Sit: with supervision PT Goal: Stand to Sit - Progress: Goal set today Pt will Ambulate: 51 - 150 feet;with supervision;with rolling walker;with standard walker PT Goal: Ambulate - Progress: Goal set today Pt will Go Up / Down Stairs: 1-2 stairs;with min assist;with least  restrictive assistive device PT Goal: Up/Down Stairs - Progress: Goal set today  PT Evaluation Precautions/Restrictions  Restrictions Weight Bearing Restrictions: No Other Position/Activity Restrictions: WBAT Prior Functioning  Home Living Lives With: Spouse Receives Help From: Family Type of Home: House Home Layout: One level Home Access: Stairs to enter Entrance Stairs-Rails: Left Entrance Stairs-Number of Steps: 2 Home Adaptive Equipment: Walker - standard Prior Function Level of Independence: Independent with basic ADLs;Independent with transfers;Independent with gait Able to Take Stairs?: Yes Driving: Yes Cognition Cognition Arousal/Alertness: Awake/alert Overall Cognitive Status: Appears within functional limits for tasks assessed Orientation Level: Oriented X4 Sensation/Coordination Coordination Gross Motor Movements are Fluid and Coordinated: Yes Extremity Assessment RUE Assessment RUE Assessment: Within Functional Limits LUE Assessment LUE Assessment: Within Functional Limits RLE Assessment RLE Assessment: Exceptions to [facility] (hip flex 75, abd 20, knee flex 75) LLE Assessment LLE Assessment: Exceptions to [facility] (knee flex ltd to ~60) Mobility (including Balance) Bed Mobility Bed Mobility: Yes Supine to Sit: 3: Mod assist Supine to Sit Details (indicate cue type and reason): cues for sequence and use of UEs Transfers Transfers: Yes Sit to Stand: 3: Mod assist;From bed;With upper extremity assist Sit to Stand Details (indicate cue type and reason): cues for use of UEs and for LE position Stand to Sit: 3: Mod assist;With armrests;To chair/3-in-1;With upper extremity assist Stand to Sit Details: cues for use of UEs and for LE position Ambulation/Gait Ambulation/Gait: Yes Ambulation/Gait Assistance: 1: +2 Total assist Ambulation/Gait Assistance Details (indicate cue type and reason): cues for posture, sequence and position from RW.  Assist to advance R  LE Ambulation Distance (Feet): 5 Feet (ltd by nausea) Assistive device: Rolling walker Gait  Pattern: Step-to pattern    Exercise  Total Joint Exercises Ankle Circles/Pumps: AROM;Both;10 reps;Supine Heel Slides: AAROM;10 reps;Supine;Right Hip ABduction/ADduction: AAROM;10 reps;Supine;Right End of Session PT - End of Session Equipment Utilized During Treatment: Gait belt Activity Tolerance: Other (comment) (ltd by c/o nausea) Patient left: in chair;with call bell in reach Nurse Communication: Mobility status for transfers;Mobility status for ambulation General Behavior During Session: [facility] for tasks performed Cognition: [facility] for tasks performed  Raven Mendoza 10/25/2011, 12:14 PM

## 2011-10-26 LAB — CBC
HCT: 34.2 % — ABNORMAL LOW (ref 36.0–46.0)
MCV: 85.1 fL (ref 78.0–100.0)
Platelets: 259 10*3/uL (ref 150–400)
RBC: 4.02 MIL/uL (ref 3.87–5.11)
WBC: 15.7 10*3/uL — ABNORMAL HIGH (ref 4.0–10.5)

## 2011-10-26 LAB — BASIC METABOLIC PANEL
CO2: 23 mEq/L (ref 19–32)
Chloride: 102 mEq/L (ref 96–112)
Creatinine, Ser: 0.58 mg/dL (ref 0.50–1.10)
Potassium: 3.5 mEq/L (ref 3.5–5.1)

## 2011-10-26 MED ORDER — FERROUS SULFATE 325 (65 FE) MG PO TABS: 325.0000 mg | ORAL_TABLET | Freq: Three times a day (TID) | ORAL | Status: DC

## 2011-10-26 MED ORDER — POLYETHYLENE GLYCOL 3350 17 G PO PACK: 17.0000 g | PACK | Freq: Two times a day (BID) | ORAL | Status: AC

## 2011-10-26 MED ORDER — HYDROCODONE-ACETAMINOPHEN 7.5-325 MG PO TABS: 1.0000 | ORAL_TABLET | ORAL | Status: AC | PRN

## 2011-10-26 MED ORDER — METHOCARBAMOL 500 MG PO TABS: 500.0000 mg | ORAL_TABLET | Freq: Four times a day (QID) | ORAL | Status: AC | PRN

## 2011-10-26 MED ORDER — ASPIRIN EC 325 MG PO TBEC: 325.0000 mg | DELAYED_RELEASE_TABLET | Freq: Two times a day (BID) | ORAL | Status: AC

## 2011-10-26 MED ORDER — DSS 100 MG PO CAPS: 100.0000 mg | ORAL_CAPSULE | Freq: Two times a day (BID) | ORAL | Status: AC

## 2011-10-26 NOTE — Progress Notes (Signed)
  CARE MANAGEMENT NOTE 10/26/2011  Patient:  CASHE, GATT A   Account Number:  [id number]  Date Initiated:  10/26/2011  Documentation initiated by:  Colleen Can  Subjective/Objective Assessment:   dx rt hip osteoarthritis; total hip replacemnt-anterior approach     Action/Plan:   Cm spoke with patient. Plans are for patient to return to her home in [place] where her daughter will be caregiver. She already has RW and crutches. Pt offered HH choice. Pt wants HH agency that is in network.   Anticipated DC Date:  10/26/2011   Anticipated DC Plan:  HOME W HOME HEALTH SERVICES  In-house referral  NA      DC Planning Services  CM consult      [facility] Choice  HOME HEALTH   Choice offered to / List presented to:  C-1 Patient   DME arranged  NA      DME agency  NA     HH arranged  HH-2 PT      HH agency  [facility]   Status of service:  Completed, signed off Medicare Important Message given?  NO (If response is "NO", the following Medicare IM given date fields will be blank) Date Medicare IM given:   Date Additional Medicare IM given:    Discharge Disposition:  HOME W HOME HEALTH SERVICES  Per UR Regulation:    Comments:  List of HH agencies placed in shadow chart. [facility] will start [facility] services within 48hrs of discharge. Pt for discharge today.

## 2011-10-26 NOTE — Progress Notes (Signed)
Occupational Therapy Note Spoke with patient and she doesn't feel she needs OT. She has assist at discharge per her report and all DME for OT. Will sign off per pt request. Judithann Sauger OTR/L [phone] 10/26/2011

## 2011-10-26 NOTE — Progress Notes (Signed)
Patient ID: Raven Mendoza, female   DOB: 19-Jul-1961, 51 y.o.   MRN: [id number] Subjective: 2 Days Post-Op Procedure(s) (LRB): TOTAL HIP ARTHROPLASTY ANTERIOR APPROACH (Right)    Patient reports pain as mild.  Doing well ready to go home, no events or complications  Objective:   VITALS:   Filed Vitals:   10/26/11 0600  BP: 128/80  Pulse: 75  Temp: 98.1 F (36.7 C)  Resp: 16    Neurovascular intact Incision: dressing C/D/I  LABS  Basename 10/26/11 0415 10/25/11 0420  HGB 11.5* 12.3  HCT 34.2* 36.2  WBC 15.7* 11.6*  PLT 259 239     Basename 10/26/11 0415 10/25/11 0420  NA 134* 135  K 3.5 4.1  BUN 10 10  CREATININE 0.58 0.59  GLUCOSE 149* 121*    No results found for this basename: LABPT:2,INR:2 in the last 72 hours   Assessment/Plan: 2 Days Post-Op Procedure(s) (LRB): TOTAL HIP ARTHROPLASTY ANTERIOR APPROACH (Right)   Up with therapy D/C IV fluids Discharge home with home health RTC 2 weeks Maintain surgical dressing for 8 days then remove and cover with gauze and tape.

## 2011-10-26 NOTE — Progress Notes (Signed)
Physical Therapy Treatment Patient Details Name: Raven Mendoza MRN: [id number] DOB: [birth year] Today's Date: 10/26/2011  PT Assessment/Plan  PT - Assessment/Plan PT Plan: Discharge plan remains appropriate PT Frequency: 7X/week Follow Up Recommendations: Home health PT Equipment Recommended: None recommended by PT PT Goals  Acute Rehab PT Goals PT Goal Formulation: With patient Time For Goal Achievement: 7 days Pt will go Supine/Side to Sit: with supervision PT Goal: Supine/Side to Sit - Progress: Met Pt will go Sit to Supine/Side: with supervision PT Goal: Sit to Supine/Side - Progress: Met Pt will go Sit to Stand: with supervision PT Goal: Sit to Stand - Progress: Met Pt will go Stand to Sit: with supervision PT Goal: Stand to Sit - Progress: Met Pt will Ambulate: 51 - 150 feet;with supervision;with rolling walker;with standard walker PT Goal: Ambulate - Progress: Met Pt will Go Up / Down Stairs: 1-2 stairs;with min assist;with least restrictive assistive device PT Goal: Up/Down Stairs - Progress: Met  PT Treatment Precautions/Restrictions  Restrictions Weight Bearing Restrictions: No Other Position/Activity Restrictions: WBAT Mobility (including Balance) Bed Mobility Supine to Sit: 5: Supervision Supine to Sit Details (indicate cue type and reason): cues to self assist R LE with sheet Sit to Supine: 5: Supervision Sit to Supine - Details (indicate cue type and reason): cues to assist R LE with sheet Transfers Sit to Stand: 5: Supervision;From bed;With armrests;With upper extremity assist;From chair/3-in-1 Sit to Stand Details (indicate cue type and reason): min cues for use of UEs Stand to Sit: 5: Supervision;With upper extremity assist;To bed;To chair/3-in-1;With armrests Stand to Sit Details: min cues for LE position Ambulation/Gait Ambulation/Gait Assistance: 5: Supervision Ambulation/Gait Assistance Details (indicate cue type and reason): cues for position from  RW Ambulation Distance (Feet): 175 Feet (x2) Assistive device: Rolling walker Gait Pattern: Step-to pattern;Step-through pattern Stairs: Yes Stairs Assistance: 4: Min assist Stairs Assistance Details (indicate cue type and reason): cues for sequence and placement of crutch/foot Stair Management Technique: One rail Left;Step to pattern;With crutches Number of Stairs: 4     Exercise    End of Session PT - End of Session Equipment Utilized During Treatment: Gait belt Activity Tolerance: Patient tolerated treatment well Patient left: in chair;with call bell in reach Nurse Communication: Mobility status for transfers;Mobility status for ambulation General Behavior During Session: [facility] for tasks performed Cognition: [facility] for tasks performed  Arend Bahl 10/26/2011, 11:42 AM

## 2011-10-30 NOTE — Discharge Summary (Signed)
Physician Discharge Summary  Patient ID: Raven Mendoza MRN: [id number] DOB/AGE: Apr 23, 1961 51 y.o.  Admit date: 10/24/2011 Discharge date: 10/26/2011  Procedures:  Procedure(s) (LRB): TOTAL HIP ARTHROPLASTY ANTERIOR APPROACH (Right)  Attending Physician: Dr. Durene Romans  Admission Diagnoses: Right hip osteoarthritis  Discharge Diagnoses:  Principal Problem:  *S/P right hip replacement History of hyperthyroidism with radiation therapy and now subsequent hypothyroidism.  HPI: This is a 51 year old lady with a history of end-stage osteoarthritis of her right hip that has failed conservative treatment. After discussion of treatments, benefits, risks, and options, the patient is now scheduled for total hip arthroplasty of the right hip by anterior approach. Note that the patient is planning on going home after surgery. Her medical doctor is [facility]. She is a candidate for tranexamic acid and dexamethasone and will receive that at preop and she is given her home medicines of aspirin, iron, MiraLax, Colace, and Robaxin.  PCP: No primary provider on file.   Discharged Condition: good  Hospital Course:  Patient underwent the above stated procedure on 10/24/2011. Patient tolerated the procedure well and brought to the recovery room in good condition and subsequently to the floor.  POD #1 BP: 105/69 ; Pulse: 66 ; Temp: 99 F (37.2 C) ; Resp: 18  Pt's foley was removed, as well as the hemovac drain removed. IV was changed to a saline lock. Patient reports pain as mild. No events. Neurovascular intact, dorsiflexion/plantar flexion intact, incision: dressing C/D/I, no cellulitis present and compartment soft.  LABS  Basename  10/25/11 0420   HGB  12.3  HCT  36.2   POD #2  BP: 128/80 ; Pulse: 75 ; Temp: 98.1 F (36.7 C) ; Resp: 16  Patient reports pain as mild. Doing well ready to go home, no events or complications Neurovascular intact and incision: dressing C/D/I.  LABS  Basename   10/26/11 0415   HGB  11.5*  HCT  34.2*    Discharge Exam: Extremities: Homans sign is negative, no sign of DVT, no edema, redness or tenderness in the calves or thighs and no ulcers, gangrene or trophic changes  Disposition: Home-Health Care Svc  with follow up in 2 weeks  Follow-up Information    Follow up with OLIN,Antuan Limes D in 2 weeks.   Contact information:   [facility] [address] [place] [address] [phone]          Discharge Orders    Future Orders Please Complete By Expires   Diet - low sodium heart healthy      Call MD / Call 911      Comments:   If you experience chest pain or shortness of breath, CALL 911 and be transported to the hospital emergency room.  If you develope a fever above 101 F, pus (white drainage) or increased drainage or redness at the wound, or calf pain, call your surgeon's office.   Discharge instructions      Comments:   Maintain surgical dressing for 8 days, then replace with gauze and tape. Keep the area dry and clean until follow up. Follow up in 2 weeks at [facility]. Call with any questions or concerns.     Constipation Prevention      Comments:   Drink plenty of fluids.  Prune juice may be helpful.  You may use a stool softener, such as Colace (over the counter) 100 mg twice a day.  Use MiraLax (over the counter) for constipation as needed.  Increase activity slowly as tolerated      Weight Bearing as taught in Physical Therapy      Comments:   Use a walker or crutches as instructed.   Driving restrictions      Comments:   No driving for 4 weeks   Change dressing      Comments:   Maintain surgical dressing for 8 days, then replace with 4x4 guaze and tape. Keep the area dry and clean.   TED hose      Comments:   Use stockings (TED hose) for 2 weeks on both leg(s).  You may remove them at night for sleeping.      Discharge Medication List as of 10/26/2011 12:35 PM      START taking these medications   Details  aspirin EC 325 MG tablet Take 1 tablet (325 mg total) by mouth 2 (two) times daily. X 4 weeks, Starting 10/26/2011, Until Sun 11/05/11, No Print    docusate sodium 100 MG CAPS Take 100 mg by mouth 2 (two) times daily., Starting 10/26/2011, Until Sun 11/05/11, No Print    ferrous sulfate 325 (65 FE) MG tablet Take 1 tablet (325 mg total) by mouth 3 (three) times daily after meals., Starting 10/26/2011, Until Fri 10/25/12, No Print    HYDROcodone-acetaminophen (NORCO) 7.5-325 MG per tablet Take 1-2 tablets by mouth every 4 (four) hours as needed., Starting 10/26/2011, Until Sun 11/05/11, Print    methocarbamol (ROBAXIN) 500 MG tablet Take 1 tablet (500 mg total) by mouth every 6 (six) hours as needed (muscle spasms)., Starting 10/26/2011, Until Sun 11/05/11, No Print    polyethylene glycol (MIRALAX / GLYCOLAX) packet Take 17 g by mouth 2 (two) times daily., Starting 10/26/2011, Until Sun 10/29/11, No Print      CONTINUE these medications which have NOT CHANGED   Details  Cholecalciferol (VITAMIN D) 2000 UNITS tablet Take 2,000 Units by mouth daily., Until Discontinued, Historical Med    cycloSPORINE (RESTASIS) 0.05 % ophthalmic emulsion Place 1 drop into both eyes 2 (two) times daily., Until Discontinued, Historical Med    levothyroxine (SYNTHROID, LEVOTHROID) 125 MCG tablet Take 125 mcg by mouth daily before breakfast., Until Discontinued, Historical Med    Polyethyl Glycol-Propyl Glycol (SYSTANE OP) Apply 1 drop to eye 2 (two) times daily as needed. Dry eyes , Until Discontinued, Historical Med      STOP taking these medications     ibuprofen (ADVIL,MOTRIN) 200 MG tablet Comments:  Reason for Stopping:       meloxicam (MOBIC) 15 MG tablet Comments:  Reason for Stopping:          Signed: Anastasio Auerbach. Lynda Capistran   PAC  10/30/2011, 11:52 AM

## 2011-11-16 ENCOUNTER — Encounter (HOSPITAL_COMMUNITY): Payer: Self-pay | Admitting: Orthopedic Surgery

## 2012-01-08 ENCOUNTER — Encounter (HOSPITAL_COMMUNITY): Payer: Self-pay | Admitting: Pharmacy Technician

## 2012-01-10 ENCOUNTER — Encounter (HOSPITAL_COMMUNITY): Payer: Self-pay

## 2012-01-10 ENCOUNTER — Encounter (HOSPITAL_COMMUNITY)
Admission: RE | Admit: 2012-01-10 | Discharge: 2012-01-10 | Disposition: A | Discharge status: Home / Self Care | Payer: No Typology Code available for payment source | Source: Ambulatory Visit | Attending: Orthopedic Surgery | Admitting: Orthopedic Surgery

## 2012-01-10 LAB — CBC
HCT: 38.9 % (ref 36.0–46.0)
Hemoglobin: 13 g/dL (ref 12.0–15.0)
MCV: 84 fL (ref 78.0–100.0)
RDW: 13.8 % (ref 11.5–15.5)
WBC: 7.9 10*3/uL (ref 4.0–10.5)

## 2012-01-10 LAB — DIFFERENTIAL
Basophils Absolute: 0.1 10*3/uL (ref 0.0–0.1)
Basophils Relative: 1 % (ref 0–1)
Eosinophils Relative: 2 % (ref 0–5)
Monocytes Absolute: 0.7 10*3/uL (ref 0.1–1.0)

## 2012-01-10 LAB — URINALYSIS, ROUTINE W REFLEX MICROSCOPIC
Glucose, UA: NEGATIVE mg/dL
Hgb urine dipstick: NEGATIVE
Ketones, ur: NEGATIVE mg/dL
Protein, ur: NEGATIVE mg/dL

## 2012-01-10 LAB — BASIC METABOLIC PANEL
BUN: 19 mg/dL (ref 6–23)
CO2: 25 mEq/L (ref 19–32)
Chloride: 104 mEq/L (ref 96–112)
Creatinine, Ser: 0.59 mg/dL (ref 0.50–1.10)
GFR calc Af Amer: >90 mL/min (ref >90)
Potassium: 3.6 mEq/L (ref 3.5–5.1)

## 2012-01-10 LAB — APTT: aPTT: 37 seconds (ref 24–37)

## 2012-01-10 LAB — SURGICAL PCR SCREEN: Staphylococcus aureus: NEGATIVE

## 2012-01-10 NOTE — Progress Notes (Signed)
H&P performed 01/10/2012 Dictation # [id number]

## 2012-01-10 NOTE — Patient Instructions (Signed)
[address] Raven Mendoza  01/10/2012   Your procedure is scheduled on:  01/16/12   Surgery   Tuesday    Surgery 1030-1140  Report to [facility] at    0800   AM.  Call this number if you have problems the morning of surgery: [phone]     Or PST   [id number]  [facility]   Remember:   Do not eat food   Or drink any fluids :After Midnight. Monday NIGHT    Clear liquids include soda, tea, black coffee, apple or grape juice, broth.  Take these medicines the morning of surgery with A SIP OF WATER: SYNTHYROID   Do not wear jewelry, make-up or nail polish.  Do not wear lotions, powders, or perfumes. You may wear deodorant.  Do not shave 48 hours prior to surgery.  Do not bring valuables to the hospital.  Contacts, dentures or bridgework may not be worn into surgery.  Leave suitcase in the car. After surgery it may be brought to your room.  For patients admitted to the hospital, checkout time is 11:00 AM the day of discharge.   Patients discharged the day of surgery will not be allowed to drive home.  Name and phone number of your driver:    daughter                                                                  Special Instructions: CHG Shower Use Special Wash: 1/2 bottle night before surgery and 1/2 bottle morning of surgery. REGULAR SOAP FACE AND PRIVATES              LADIES- NO SHAVING 48 HOURS BEFORE USING BETASEPT SOAP.                  Please read over the following fact sheets that you were given: MRSA Information

## 2012-01-10 NOTE — Pre-Procedure Instructions (Signed)
EKG with OV note PCP 12/12 on chart

## 2012-01-11 NOTE — H&P (Signed)
NAME:  Raven Mendoza, Raven Mendoza                ACCOUNT NO.:  [id number]  MEDICAL RECORD NO.:  [id number]  LOCATION:  [facility]                         FACILITY:  [facility]  PHYSICIAN:  Matthew D. Olin, M.D.  DATE OF BIRTH:  08/31/1961  DATE OF ADMISSION:  01/10/2012 DATE OF DISCHARGE:  01/10/2012                             HISTORY & PHYSICAL   ADMITTING DIAGNOSIS:  End-stage osteoarthritis, left knee.  HISTORY OF PRESENT ILLNESS:  This is a 50-year-old lady with a history of end-stage osteoarthritis of her left knee, who has failed conservative management.  She has had recent total hip arthroplasties with excellent response and now wishes to proceed with total knee arthroplasty on the left.  The surgery, risks, benefits, and aftercare were discussed in detail with the patient.  Questions invited and answered.  Note her medical caregiver is Surena Beth Dixon, PA and specialty physician, Dr. Altheimer, for Endocrinology.  Note that she is a candidate for tranexamic acid and dexamethasone and will receive both at surgery and she plans on going home after surgery.  She is given her home medications of aspirin, iron, Robaxin, MiraLax, and Colace today.  PAST MEDICAL HISTORY:  Drug allergy to penicillin with a rash and swelling.  CURRENT MEDICATIONS:  Synthroid, meloxicam.  PREVIOUS SURGERIES:  C-section x2 and total hip arthroplasty.  SERIOUS MEDICAL ILLNESSES:  Hypothyroidism and osteoarthritis.  FAMILY HISTORY:  Positive for stroke.  SOCIAL HISTORY:  Patient is married.  She lives at home.  She is a farmer.  She does not smoke and does not drink and again is planning on going home after surgery.  REVIEW OF SYSTEMS:  CENTRAL NERVOUS SYSTEM:  Negative for headache, blurred vision, or dizziness.  PULMONARY:  Negative shortness breath, PND, or orthopnea.  CARDIOVASCULAR:  Negative for chest pain and palpitations.  GI:  Negative for ulcers, hepatitis.  GU:  Negative for urinary tract difficulty.   MUSCULOSKELETAL:  Positive as in HPI.  PHYSICAL EXAMINATION:  GENERAL:  This is a well-developed, well- nourished lady, in no acute distress. VITAL SIGNS:  Blood pressure today is 159/80, pulse 84 and regular, respirations 14. HEENT.  Head normocephalic.  Nose patent.  Ears patent.  Pupils equal, round, and reactive to light.  Throat without injection. NECK:  Supple without adenopathy.  Carotids 2+ without bruit. CHEST:  Clear to auscultation.  No rales or rhonchi.  Respirations 14. HEART:  Regular rate and rhythm at 84 beats per minute without murmur. ABDOMEN:  Soft.  Active bowel sounds.  No masses or organomegaly. NEUROLOGIC:  The patient is alert and oriented to time, place, person. Cranial nerves II-XII grossly intact. EXTREMITIES:  Shows status post total hip arthroplasty with good result. Her left knee shows 5 degrees from full extension with further flexion to 95 degrees with pain.  Sensation and circulation are intact.  ASSESSMENT:  Osteoarthritis, left knee.  PLAN:  Total knee arthroplasty of the left knee.     Kaisen Ackers J. Marvin Grabill, P.A.   ______________________________ Matthew D. Olin, M.D.    SJC/MEDQ  D:  01/10/2012  T:  01/11/2012  Job:  [id number] 

## 2012-01-16 ENCOUNTER — Inpatient Hospital Stay (HOSPITAL_COMMUNITY)
Admission: RE | Admit: 2012-01-16 | Discharge: 2012-01-18 | DRG: 470 | Disposition: A | Discharge status: Home Health | Payer: No Typology Code available for payment source | Source: Ambulatory Visit | Attending: Orthopedic Surgery | Admitting: Orthopedic Surgery

## 2012-01-16 ENCOUNTER — Ambulatory Visit (HOSPITAL_COMMUNITY): Payer: No Typology Code available for payment source | Admitting: Anesthesiology

## 2012-01-16 ENCOUNTER — Encounter (HOSPITAL_COMMUNITY): Payer: Self-pay | Admitting: *Deleted

## 2012-01-16 ENCOUNTER — Encounter (HOSPITAL_COMMUNITY)
Admission: RE | Disposition: A | Discharge status: Home Health | Payer: Self-pay | Source: Ambulatory Visit | Attending: Orthopedic Surgery

## 2012-01-16 ENCOUNTER — Encounter (HOSPITAL_COMMUNITY): Payer: Self-pay | Admitting: Anesthesiology

## 2012-01-16 DIAGNOSIS — Z96659 Presence of unspecified artificial knee joint: Secondary | ICD-10-CM

## 2012-01-16 DIAGNOSIS — E039 Hypothyroidism, unspecified: Secondary | ICD-10-CM | POA: Diagnosis present

## 2012-01-16 DIAGNOSIS — M1710 Unilateral primary osteoarthritis, unspecified knee: Principal | ICD-10-CM | POA: Diagnosis present

## 2012-01-16 DIAGNOSIS — Z96649 Presence of unspecified artificial hip joint: Secondary | ICD-10-CM

## 2012-01-16 DIAGNOSIS — E669 Obesity, unspecified: Secondary | ICD-10-CM | POA: Diagnosis present

## 2012-01-16 DIAGNOSIS — Z01812 Encounter for preprocedural laboratory examination: Secondary | ICD-10-CM

## 2012-01-16 HISTORY — PX: TOTAL KNEE ARTHROPLASTY: SHX125

## 2012-01-16 LAB — TYPE AND SCREEN: ABO/RH(D): O POS

## 2012-01-16 SURGERY — ARTHROPLASTY, KNEE, TOTAL
Anesthesia: Spinal | Site: Knee | Laterality: Left | Wound class: Clean

## 2012-01-16 MED ORDER — MENTHOL 3 MG MT LOZG
1.0000 | LOZENGE | OROMUCOSAL | Status: DC | PRN
  Filled 2012-01-16: qty 9

## 2012-01-16 MED ORDER — LEVOTHYROXINE SODIUM 125 MCG PO TABS
125.0000 ug | ORAL_TABLET | Freq: Every day | ORAL | Status: DC
  Administered 2012-01-17 – 2012-01-18 (×2): 125 ug via ORAL
  Filled 2012-01-16 (×3): qty 1

## 2012-01-16 MED ORDER — SODIUM CHLORIDE 0.9 % IV SOLN
INTRAVENOUS | Status: DC
  Administered 2012-01-16 – 2012-01-17 (×2): via INTRAVENOUS
  Filled 2012-01-16 (×4): qty 1000

## 2012-01-16 MED ORDER — PROPOFOL 10 MG/ML IV EMUL
INTRAVENOUS | Status: DC | PRN
  Administered 2012-01-16: 100 ug/kg/min via INTRAVENOUS

## 2012-01-16 MED ORDER — METHOCARBAMOL 500 MG PO TABS
500.0000 mg | ORAL_TABLET | Freq: Four times a day (QID) | ORAL | Status: DC | PRN
  Administered 2012-01-17 – 2012-01-18 (×3): 500 mg via ORAL
  Filled 2012-01-16 (×3): qty 1

## 2012-01-16 MED ORDER — TRANEXAMIC ACID 100 MG/ML IV SOLN
1355.0000 mg | INTRAVENOUS | Status: DC | PRN
  Administered 2012-01-16: 1355 mg via INTRAVENOUS

## 2012-01-16 MED ORDER — CLINDAMYCIN PHOSPHATE 600 MG/50ML IV SOLN
600.0000 mg | Freq: Four times a day (QID) | INTRAVENOUS | Status: AC
Start: 2012-01-16 — End: 2012-01-17
  Administered 2012-01-16 – 2012-01-17 (×3): 600 mg via INTRAVENOUS
  Filled 2012-01-16 (×3): qty 50

## 2012-01-16 MED ORDER — DEXAMETHASONE SODIUM PHOSPHATE 10 MG/ML IJ SOLN: 10.0000 mg | Freq: Once | INTRAMUSCULAR | Status: DC

## 2012-01-16 MED ORDER — HYDROMORPHONE HCL PF 1 MG/ML IJ SOLN
INTRAMUSCULAR | Status: DC | PRN
  Administered 2012-01-16 (×2): 1 mg via INTRAVENOUS

## 2012-01-16 MED ORDER — ACETAMINOPHEN 10 MG/ML IV SOLN
INTRAVENOUS | Status: AC
  Filled 2012-01-16: qty 100

## 2012-01-16 MED ORDER — HYDROMORPHONE HCL PF 1 MG/ML IJ SOLN: 0.2500 mg | INTRAMUSCULAR | Status: DC | PRN

## 2012-01-16 MED ORDER — PHENOL 1.4 % MT LIQD
1.0000 | OROMUCOSAL | Status: DC | PRN
  Filled 2012-01-16: qty 177

## 2012-01-16 MED ORDER — SENNA 8.6 MG PO TABS
1.0000 | ORAL_TABLET | Freq: Two times a day (BID) | ORAL | Status: DC
  Administered 2012-01-16 – 2012-01-18 (×4): 8.6 mg via ORAL
  Filled 2012-01-16 (×4): qty 1

## 2012-01-16 MED ORDER — ONDANSETRON HCL 4 MG/2ML IJ SOLN: 4.0000 mg | Freq: Four times a day (QID) | INTRAMUSCULAR | Status: DC | PRN

## 2012-01-16 MED ORDER — CLINDAMYCIN PHOSPHATE 600 MG/50ML IV SOLN
INTRAVENOUS | Status: DC | PRN
  Administered 2012-01-16: 600 mg via INTRAVENOUS

## 2012-01-16 MED ORDER — HYDROCODONE-ACETAMINOPHEN 7.5-325 MG PO TABS
1.0000 | ORAL_TABLET | ORAL | Status: DC
  Administered 2012-01-16 (×2): 2 via ORAL
  Administered 2012-01-16: 1 via ORAL
  Administered 2012-01-17 – 2012-01-18 (×9): 2 via ORAL
  Filled 2012-01-16 (×4): qty 2
  Filled 2012-01-16: qty 1
  Filled 2012-01-16 (×7): qty 2

## 2012-01-16 MED ORDER — FERROUS SULFATE 325 (65 FE) MG PO TABS
325.0000 mg | ORAL_TABLET | Freq: Three times a day (TID) | ORAL | Status: DC
  Administered 2012-01-16 – 2012-01-18 (×6): 325 mg via ORAL
  Filled 2012-01-16 (×10): qty 1

## 2012-01-16 MED ORDER — LACTATED RINGERS IV SOLN
INTRAVENOUS | Status: DC | PRN
  Administered 2012-01-16 (×3): via INTRAVENOUS

## 2012-01-16 MED ORDER — LACTATED RINGERS IV SOLN: INTRAVENOUS | Status: DC

## 2012-01-16 MED ORDER — LACTATED RINGERS IV SOLN
INTRAVENOUS | Status: DC
  Administered 2012-01-16: 1000 mL via INTRAVENOUS

## 2012-01-16 MED ORDER — HYDROMORPHONE HCL PF 1 MG/ML IJ SOLN
0.2000 mg | INTRAMUSCULAR | Status: DC | PRN
  Administered 2012-01-17: 0.6 mg via INTRAVENOUS
  Filled 2012-01-16: qty 1

## 2012-01-16 MED ORDER — ACETAMINOPHEN 10 MG/ML IV SOLN
INTRAVENOUS | Status: DC | PRN
  Administered 2012-01-16: 1000 mg via INTRAVENOUS

## 2012-01-16 MED ORDER — ONDANSETRON HCL 4 MG/2ML IJ SOLN
INTRAMUSCULAR | Status: DC | PRN
  Administered 2012-01-16 (×2): 2 mg via INTRAVENOUS

## 2012-01-16 MED ORDER — DIPHENHYDRAMINE HCL 12.5 MG/5ML PO ELIX: 25.0000 mg | ORAL_SOLUTION | Freq: Four times a day (QID) | ORAL | Status: DC | PRN

## 2012-01-16 MED ORDER — METHOCARBAMOL 100 MG/ML IJ SOLN
500.0000 mg | Freq: Four times a day (QID) | INTRAVENOUS | Status: DC | PRN
  Filled 2012-01-16 (×2): qty 5

## 2012-01-16 MED ORDER — POLYETHYLENE GLYCOL 3350 17 G PO PACK
17.0000 g | PACK | Freq: Every day | ORAL | Status: DC | PRN
  Filled 2012-01-16: qty 1

## 2012-01-16 MED ORDER — CYCLOSPORINE 0.05 % OP EMUL
1.0000 [drp] | Freq: Two times a day (BID) | OPHTHALMIC | Status: DC
  Administered 2012-01-16 – 2012-01-17 (×4): 1 [drp] via OPHTHALMIC
  Filled 2012-01-16 (×6): qty 1

## 2012-01-16 MED ORDER — TRANEXAMIC ACID 100 MG/ML IV SOLN: 1.5000 mg/kg/h | INTRAVENOUS | Status: DC

## 2012-01-16 MED ORDER — DOCUSATE SODIUM 100 MG PO CAPS
100.0000 mg | ORAL_CAPSULE | Freq: Two times a day (BID) | ORAL | Status: DC
  Administered 2012-01-16 – 2012-01-18 (×5): 100 mg via ORAL
  Filled 2012-01-16 (×6): qty 1

## 2012-01-16 MED ORDER — RIVAROXABAN 10 MG PO TABS
10.0000 mg | ORAL_TABLET | Freq: Every day | ORAL | Status: DC
  Administered 2012-01-17 – 2012-01-18 (×2): 10 mg via ORAL
  Filled 2012-01-16 (×4): qty 1

## 2012-01-16 MED ORDER — CLINDAMYCIN PHOSPHATE 600 MG/50ML IV SOLN
600.0000 mg | INTRAVENOUS | Status: DC
  Filled 2012-01-16: qty 50

## 2012-01-16 MED ORDER — DEXAMETHASONE SODIUM PHOSPHATE 10 MG/ML IJ SOLN
INTRAMUSCULAR | Status: DC | PRN
  Administered 2012-01-16: 10 mg via INTRAVENOUS

## 2012-01-16 MED ORDER — MIDAZOLAM HCL 5 MG/5ML IJ SOLN
INTRAMUSCULAR | Status: DC | PRN
  Administered 2012-01-16 (×2): 1 mg via INTRAVENOUS

## 2012-01-16 MED ORDER — FENTANYL CITRATE 0.05 MG/ML IJ SOLN
INTRAMUSCULAR | Status: DC | PRN
  Administered 2012-01-16 (×2): 50 ug via INTRAVENOUS

## 2012-01-16 MED ORDER — ZOLPIDEM TARTRATE 5 MG PO TABS: 5.0000 mg | ORAL_TABLET | Freq: Every evening | ORAL | Status: DC | PRN

## 2012-01-16 MED ORDER — PROMETHAZINE HCL 25 MG/ML IJ SOLN: 6.2500 mg | INTRAMUSCULAR | Status: DC | PRN

## 2012-01-16 MED ORDER — KETOROLAC TROMETHAMINE 30 MG/ML IJ SOLN
INTRAMUSCULAR | Status: DC | PRN
  Administered 2012-01-16: 30 mg

## 2012-01-16 MED ORDER — BUPIVACAINE-EPINEPHRINE PF 0.25-1:200000 % IJ SOLN
INTRAMUSCULAR | Status: DC | PRN
  Administered 2012-01-16: 60 mL

## 2012-01-16 MED ORDER — ALUM & MAG HYDROXIDE-SIMETH 200-200-20 MG/5ML PO SUSP: 30.0000 mL | ORAL | Status: DC | PRN

## 2012-01-16 MED ORDER — ONDANSETRON HCL 4 MG PO TABS: 4.0000 mg | ORAL_TABLET | Freq: Four times a day (QID) | ORAL | Status: DC | PRN

## 2012-01-16 MED ORDER — TRANEXAMIC ACID 100 MG/ML IV SOLN
15.0000 mg/kg | Freq: Once | INTRAVENOUS | Status: DC
  Filled 2012-01-16: qty 13.55

## 2012-01-16 SURGICAL SUPPLY — 55 items
BAG ZIPLOCK 12X15 (MISCELLANEOUS) ×2 IMPLANT
BANDAGE ELASTIC 6 VELCRO ST LF (GAUZE/BANDAGES/DRESSINGS) ×2 IMPLANT
BANDAGE ESMARK 6X9 LF (GAUZE/BANDAGES/DRESSINGS) ×1 IMPLANT
BLADE SAW SGTL 13.0X1.19X90.0M (BLADE) ×2 IMPLANT
BNDG ESMARK 6X9 LF (GAUZE/BANDAGES/DRESSINGS) ×2
BOWL SMART MIX CTS (DISPOSABLE) ×2 IMPLANT
CEMENT HV SMART SET (Cement) ×4 IMPLANT
CLOTH BEACON ORANGE TIMEOUT ST (SAFETY) ×2 IMPLANT
CUFF TOURN SGL QUICK 34 (TOURNIQUET CUFF) ×1
CUFF TRNQT CYL 34X4X40X1 (TOURNIQUET CUFF) ×1 IMPLANT
DECANTER SPIKE VIAL GLASS SM (MISCELLANEOUS) ×2 IMPLANT
DERMABOND ADVANCED (GAUZE/BANDAGES/DRESSINGS) ×1
DERMABOND ADVANCED .7 DNX12 (GAUZE/BANDAGES/DRESSINGS) ×1 IMPLANT
DRAPE EXTREMITY T 121X128X90 (DRAPE) ×2 IMPLANT
DRAPE POUCH INSTRU U-SHP 10X18 (DRAPES) ×2 IMPLANT
DRAPE U-SHAPE 47X51 STRL (DRAPES) ×2 IMPLANT
DRSG AQUACEL AG ADV 3.5X10 (GAUZE/BANDAGES/DRESSINGS) ×2 IMPLANT
DRSG TEGADERM 4X4.75 (GAUZE/BANDAGES/DRESSINGS) ×2 IMPLANT
DURAPREP 26ML APPLICATOR (WOUND CARE) ×2 IMPLANT
ELECT REM PT RETURN 9FT ADLT (ELECTROSURGICAL) ×2
ELECTRODE REM PT RTRN 9FT ADLT (ELECTROSURGICAL) ×1 IMPLANT
EVACUATOR 1/8 PVC DRAIN (DRAIN) ×2 IMPLANT
FACESHIELD LNG OPTICON STERILE (SAFETY) ×10 IMPLANT
GAUZE SPONGE 2X2 8PLY STRL LF (GAUZE/BANDAGES/DRESSINGS) ×1 IMPLANT
GLOVE BIOGEL PI IND STRL 7.5 (GLOVE) ×1 IMPLANT
GLOVE BIOGEL PI IND STRL 8 (GLOVE) ×1 IMPLANT
GLOVE BIOGEL PI INDICATOR 7.5 (GLOVE) ×1
GLOVE BIOGEL PI INDICATOR 8 (GLOVE) ×1
GLOVE ECLIPSE 8.0 STRL XLNG CF (GLOVE) ×2 IMPLANT
GLOVE ORTHO TXT STRL SZ7.5 (GLOVE) ×4 IMPLANT
GOWN BRE IMP PREV XXLGXLNG (GOWN DISPOSABLE) ×4 IMPLANT
GOWN STRL NON-REIN LRG LVL3 (GOWN DISPOSABLE) ×2 IMPLANT
HANDPIECE INTERPULSE COAX TIP (DISPOSABLE) ×1
IMMOBILIZER KNEE 20 (SOFTGOODS)
IMMOBILIZER KNEE 20 THIGH 36 (SOFTGOODS) IMPLANT
KIT BASIN OR (CUSTOM PROCEDURE TRAY) ×2 IMPLANT
MANIFOLD NEPTUNE II (INSTRUMENTS) ×2 IMPLANT
NDL SAFETY ECLIPSE 18X1.5 (NEEDLE) ×1 IMPLANT
NEEDLE HYPO 18GX1.5 SHARP (NEEDLE) ×1
NS IRRIG 1000ML POUR BTL (IV SOLUTION) ×4 IMPLANT
PACK TOTAL JOINT (CUSTOM PROCEDURE TRAY) ×2 IMPLANT
POSITIONER SURGICAL ARM (MISCELLANEOUS) ×2 IMPLANT
SET HNDPC FAN SPRY TIP SCT (DISPOSABLE) ×1 IMPLANT
SET PAD KNEE POSITIONER (MISCELLANEOUS) ×2 IMPLANT
SPONGE GAUZE 2X2 STER 10/PKG (GAUZE/BANDAGES/DRESSINGS) ×1
SUCTION FRAZIER 12FR DISP (SUCTIONS) ×2 IMPLANT
SUT MNCRL AB 4-0 PS2 18 (SUTURE) ×2 IMPLANT
SUT VIC AB 1 CT1 36 (SUTURE) ×6 IMPLANT
SUT VIC AB 2-0 CT1 27 (SUTURE) ×3
SUT VIC AB 2-0 CT1 TAPERPNT 27 (SUTURE) ×3 IMPLANT
SYR 50ML LL SCALE MARK (SYRINGE) ×2 IMPLANT
TOWEL OR 17X26 10 PK STRL BLUE (TOWEL DISPOSABLE) ×4 IMPLANT
TRAY FOLEY CATH 14FRSI W/METER (CATHETERS) ×2 IMPLANT
WATER STERILE IRR 1500ML POUR (IV SOLUTION) ×2 IMPLANT
WRAP KNEE MAXI GEL POST OP (GAUZE/BANDAGES/DRESSINGS) ×2 IMPLANT

## 2012-01-16 NOTE — Anesthesia Preprocedure Evaluation (Addendum)
Anesthesia Evaluation  Patient identified by MRN, date of birth, ID band Patient awake    Reviewed: Allergy & Precautions, H&P , NPO status , Patient's Chart, lab work & pertinent test results  Airway Mallampati: II TM Distance: >3 FB Neck ROM: Full    Dental No notable dental hx.    Pulmonary neg pulmonary ROS,  breath sounds clear to auscultation  Pulmonary exam normal       Cardiovascular negative cardio ROS  Rhythm:Regular Rate:Normal     Neuro/Psych negative neurological ROS  negative psych ROS   GI/Hepatic negative GI ROS, Neg liver ROS,   Endo/Other  Hypothyroidism   Renal/GU negative Renal ROS  negative genitourinary   Musculoskeletal negative musculoskeletal ROS (+)   Abdominal (+) + obese,   Peds negative pediatric ROS (+)  Hematology negative hematology ROS (+)   Anesthesia Other Findings   Reproductive/Obstetrics negative OB ROS                           Anesthesia Physical Anesthesia Plan  ASA: II  Anesthesia Plan: Spinal   Post-op Pain Management:    Induction: Intravenous  Airway Management Planned:   Additional Equipment:   Intra-op Plan:   Post-operative Plan:   Informed Consent: I have reviewed the patients History and Physical, chart, labs and discussed the procedure including the risks, benefits and alternatives for the proposed anesthesia with the patient or authorized representative who has indicated his/her understanding and acceptance.   Dental advisory given  Plan Discussed with: CRNA  Anesthesia Plan Comments: (Discussed risks/benefits of spinal including headache, backache, failure, bleeding, infection, and nerve damage. Patient consents to spinal. Questions answered. Coagulation studies and platelet count acceptable. Off mobic for greater than a week.)       Anesthesia Quick Evaluation

## 2012-01-16 NOTE — Anesthesia Postprocedure Evaluation (Signed)
  Anesthesia Post-op Note  Patient: Raven Mendoza  Procedure(s) Performed: Procedure(s) (LRB): TOTAL KNEE ARTHROPLASTY (Left)  Patient Location: PACU  Anesthesia Type: Spinal  Level of Consciousness: awake and alert   Airway and Oxygen Therapy: Patient Spontanous Breathing  Post-op Pain: mild  Post-op Assessment: Post-op Vital signs reviewed, Patient's Cardiovascular Status Stable, Respiratory Function Stable, Patent Airway and No signs of Nausea or vomiting  Post-op Vital Signs: stable  Complications: No apparent anesthesia complications. No complaints. Moving both feet. Spinal has resolved.

## 2012-01-16 NOTE — H&P (View-Only) (Signed)
Raven Mendoza, Raven Mendoza                ACCOUNT NO.:  [id number]  MEDICAL RECORD NO.:  [id number]  LOCATION:  [facility]                         FACILITY:  [facility]  PHYSICIAN:  Madlyn Frankel. Charlann Boxer, M.D.  DATE OF BIRTH:  1961/08/08  DATE OF ADMISSION:  01/10/2012 DATE OF DISCHARGE:  01/10/2012                             HISTORY & PHYSICAL   ADMITTING DIAGNOSIS:  End-stage osteoarthritis, left knee.  HISTORY OF PRESENT ILLNESS:  This is a 51 year old lady with a history of end-stage osteoarthritis of her left knee, who has failed conservative management.  She has had recent total hip arthroplasties with excellent response and now wishes to proceed with total knee arthroplasty on the left.  The surgery, risks, benefits, and aftercare were discussed in detail with the patient.  Questions invited and answered.  Note her medical caregiver is Frazier Richards, [place] and specialty physician, Dr. Leslie Dales, for Endocrinology.  Note that she is a candidate for tranexamic acid and dexamethasone and will receive both at surgery and she plans on going home after surgery.  She is given her home medications of aspirin, iron, Robaxin, MiraLax, and Colace today.  PAST MEDICAL HISTORY:  Drug allergy to penicillin with a rash and swelling.  CURRENT MEDICATIONS:  Synthroid, meloxicam.  PREVIOUS SURGERIES:  C-section x2 and total hip arthroplasty.  SERIOUS MEDICAL ILLNESSES:  Hypothyroidism and osteoarthritis.  FAMILY HISTORY:  Positive for stroke.  SOCIAL HISTORY:  Patient is married.  She lives at home.  She is a Visual merchandiser.  She does not smoke and does not drink and again is planning on going home after surgery.  REVIEW OF SYSTEMS:  CENTRAL NERVOUS SYSTEM:  Negative for headache, blurred vision, or dizziness.  PULMONARY:  Negative shortness breath, PND, or orthopnea.  CARDIOVASCULAR:  Negative for chest pain and palpitations.  GI:  Negative for ulcers, hepatitis.  GU:  Negative for urinary tract difficulty.   MUSCULOSKELETAL:  Positive as in HPI.  PHYSICAL EXAMINATION:  GENERAL:  This is a well-developed, well- nourished lady, in no acute distress. VITAL SIGNS:  Blood pressure today is 159/80, pulse 84 and regular, respirations 14. HEENT.  Head normocephalic.  Nose patent.  Ears patent.  Pupils equal, round, and reactive to light.  Throat without injection. NECK:  Supple without adenopathy.  Carotids 2+ without bruit. CHEST:  Clear to auscultation.  No rales or rhonchi.  Respirations 14. HEART:  Regular rate and rhythm at 84 beats per minute without murmur. ABDOMEN:  Soft.  Active bowel sounds.  No masses or organomegaly. NEUROLOGIC:  The patient is alert and oriented to time, place, person. Cranial nerves II-XII grossly intact. EXTREMITIES:  Shows status post total hip arthroplasty with good result. Her left knee shows 5 degrees from full extension with further flexion to 95 degrees with pain.  Sensation and circulation are intact.  ASSESSMENT:  Osteoarthritis, left knee.  PLAN:  Total knee arthroplasty of the left knee.     Jaquelyn Bitter. Arrayah Connors, P.A.   ______________________________ Madlyn Frankel Charlann Boxer, M.D.    SJC/MEDQ  D:  01/10/2012  T:  01/11/2012  Job:  [id number]

## 2012-01-16 NOTE — Anesthesia Procedure Notes (Addendum)
Anesthesia Regional Block:   Narrative:    Spinal  Patient location during procedure: OR Start time: 01/16/2012 9:29 AM Staffing CRNA/Resident: Phillips Grout E Performed by: resident/CRNA  Preanesthetic Checklist Completed: patient identified, site marked, surgical consent, pre-op evaluation, timeout performed, IV checked, risks and benefits discussed and monitors and equipment checked Spinal Block Patient position: sitting Prep: Betadine Patient monitoring: heart rate, continuous pulse ox and blood pressure Approach: midline Location: L3-4 Injection technique: single-shot Needle Needle type: Sprotte  Needle gauge: 24 G Needle length: 5 cm Assessment Sensory level: T6 Additional Notes Negative heme, paresthesias, pain.  Good csf flow, clear.  Tolerated well.  Spinal  Additional Notes Lot no. [id number]  Expire 05/2013  Spinal

## 2012-01-16 NOTE — Transfer of Care (Signed)
Immediate Anesthesia Transfer of Care Note  Patient: Raven Mendoza  Procedure(s) Performed: Procedure(s) (LRB): TOTAL KNEE ARTHROPLASTY (Left)  Patient Location: PACU  Anesthesia Type: Regional  Level of Consciousness: awake, alert , oriented and patient cooperative  Airway & Oxygen Therapy: Patient Spontanous Breathing and Patient connected to face mask oxygen  Post-op Assessment: Report given to PACU RN and Post -op Vital signs reviewed and stable  Post vital signs: Reviewed and stable  Complications: No apparent anesthesia complications

## 2012-01-16 NOTE — Preoperative (Signed)
Beta Blockers   Reason not to administer Beta Blockers:Not Applicable 

## 2012-01-16 NOTE — Interval H&P Note (Signed)
History and Physical Interval Note:  01/16/2012 8:48 AM  Raven Mendoza  has presented today for surgery, with the diagnosis of Osteoarthritis of the Left Knee  The various methods of treatment have been discussed with the patient and family. After consideration of risks, benefits and other options for treatment, the patient has consented to  Procedure(s) (LRB): LEFT TOTAL KNEE ARTHROPLASTY (Left) as a surgical intervention .  The patients' history has been reviewed, patient examined, no change in status, stable for surgery.  I have reviewed the patients' chart and labs.  Questions were answered to the patient's satisfaction.     Shelda Pal

## 2012-01-16 NOTE — Op Note (Signed)
NAME:  Raven Mendoza                      MEDICAL RECORD NO.:  [id number]                             FACILITY:  [facility]      PHYSICIAN:  Madlyn Frankel. Charlann Boxer, M.D.  DATE OF BIRTH:  Sep 13, 1961      DATE OF PROCEDURE:  01/16/2012                                     OPERATIVE REPORT         PREOPERATIVE DIAGNOSIS:  Left knee osteoarthritis.      POSTOPERATIVE DIAGNOSIS:  Left knee osteoarthritis.      FINDINGS:  The patient was noted to have complete loss of cartilage and   bone-on-bone arthritis with associated osteophytes in the medial, lateral and patellofemoral compartments of   the knee with a significant synovitis and associated effusion.      PROCEDURE:  Left total knee replacement.      COMPONENTS USED:  DePuy rotating platform posterior stabilized knee   system, a size 3 femur, 3 tibia, 10 mm insert, and 35 patellar   button.      SURGEON:  Madlyn Frankel. Charlann Boxer, M.D.      ASSISTANT:  Leilani Able, PA-C.      ANESTHESIA:  Spinal.      SPECIMENS:  None.      COMPLICATION:  None.      DRAINS:  One Hemovac.  EBL: <100cc      TOURNIQUET TIME:   Total Tourniquet Time Documented: Thigh (Left) - 35 minutes .      The patient was stable to the recovery room.      INDICATION FOR PROCEDURE:  Raven Mendoza is a 51 y.o. female patient of   mine.  The patient had been seen, evaluated, and treated conservatively in the   office with medication, activity modification, and injections.  The patient had   radiographic changes of bone-on-bone arthritis with endplate sclerosis and osteophytes noted.      The patient failed conservative measures including medication, injections, and activity modification, and at this point was ready for more definitive measures.   Based on the radiographic changes and failed conservative measures, the patient   decided to proceed with total knee replacement.  Risks of infection,   DVT, component failure, need for revision surgery, postop course, and   expectations were all   discussed and reviewed.  Consent was obtained for benefit of pain   relief.      PROCEDURE IN DETAIL:  The patient was brought to the operative theater.   Once adequate anesthesia, preoperative antibiotics, 600mg  of Clindamycin administered, the patient was positioned supine with the left thigh tourniquet placed.  The  left lower extremity was prepped and draped in sterile fashion.  A time-   out was performed identifying the patient, planned procedure, and   extremity.      The left lower extremity was placed in the [facility] leg holder.  The leg was   exsanguinated, tourniquet elevated to 250 mmHg.  A midline incision was   made followed by median parapatellar arthrotomy.  Following initial   exposure, attention was first directed to the patella.  Precut  measurement was noted to be 20 mm.  I resected down to 14 mm and used a   35 patellar button to restore patellar height as well as cover the cut   surface.      The lug holes were drilled and a metal shim was placed to protect the   patella from retractors and saw blades.      At this point, attention was now directed to the femur.  The femoral   canal was opened with a drill, irrigated to try to prevent fat emboli.  An   intramedullary rod was passed at 5 degrees valgus, 10 mm of bone was   resected off the distal femur.  Following this resection, the tibia was   subluxated anteriorly.  Using the extramedullary guide, 10 mm of bone was resected off   the proximal lateral tibia.  We confirmed the gap would be   stable medially and laterally with a 10 mm insert as well as confirmed   the cut was perpendicular in the coronal plane, checking with an alignment rod.      Once this was done, I sized the femur to be a size 3 in the anterior-   posterior dimension, chose a 3 standard component based on medial and   lateral dimension.  The size 3 rotation block was then pinned in   position anterior referenced using  the C-clamp to set rotation.  The   anterior, posterior, and  chamfer cuts were made without difficulty nor   notching making certain that I was along the anterior cortex to help   with flexion gap stability.      The final box cut was made off the lateral aspect of distal femur.      At this point, the tibia was sized to be a size 3, the size 3 tray was   then pinned in position through the medial third of the tubercle,   drilled, and keel punched.  Trial reduction was now carried with a 3 femur,  3 tibia, a 10 mm insert, and the 35 patella botton.  The knee was brought to   extension, full extension with good flexion stability with the patella   tracking through the trochlea without application of pressure.  Given   all these findings, the trial components removed.  Final components were   opened and cement was mixed.  The knee was irrigated with normal saline   solution and pulse lavage.  The synovial lining was   then injected with 0.25% Marcaine with epinephrine and 1 cc of Toradol,   total of 61 cc.      The knee was irrigated.  Final implants were then cemented onto clean and   dried cut surfaces of bone with the knee brought to extension with a 10   mm trial insert.      Once the cement had fully cured, the excess cement was removed   throughout the knee.  I confirmed I was satisfied with the range of   motion and stability, and the final 10 mm insert was chosen.  It was   placed into the knee.      The tourniquet had been let down at 34 minutes.  No significant   hemostasis required.  The medium Hemovac drain was placed deep.  The   extensor mechanism was then reapproximated using #1 Vicryl with the knee   in flexion.  The   remaining wound was closed with 2-0  Vicryl and running 4-0 Monocryl.   The knee was cleaned, dried, dressed sterilely using Dermabond and   Aquacel dressing.  Drain site dressed separately.  The patient was then   brought to recovery room in stable  condition, tolerating the procedure   well.   Please note that Physician Assistant, Leilani Able, was present for the entirety of the case, and was utilized for pre-operative positioning, peri-operative retractor management, general facilitation of the procedure.  He was also utilized for primary wound closure at the end of the case.              Madlyn Frankel Charlann Boxer, M.D.

## 2012-01-17 ENCOUNTER — Encounter (HOSPITAL_COMMUNITY): Payer: Self-pay | Admitting: Orthopedic Surgery

## 2012-01-17 LAB — CBC
HCT: 37.1 % (ref 36.0–46.0)
RDW: 13.6 % (ref 11.5–15.5)
WBC: 12.9 10*3/uL — ABNORMAL HIGH (ref 4.0–10.5)

## 2012-01-17 LAB — BASIC METABOLIC PANEL
BUN: 10 mg/dL (ref 6–23)
Chloride: 105 mEq/L (ref 96–112)
GFR calc Af Amer: >90 mL/min (ref >90)
Potassium: 4 mEq/L (ref 3.5–5.1)

## 2012-01-17 MED ORDER — ASPIRIN EC 325 MG PO TBEC: 325.0000 mg | DELAYED_RELEASE_TABLET | Freq: Two times a day (BID) | ORAL | Status: AC

## 2012-01-17 MED ORDER — DSS 100 MG PO CAPS: 100.0000 mg | ORAL_CAPSULE | Freq: Two times a day (BID) | ORAL | Status: AC

## 2012-01-17 MED ORDER — POLYETHYLENE GLYCOL 3350 17 G PO PACK: 17.0000 g | PACK | Freq: Every day | ORAL | Status: AC | PRN

## 2012-01-17 MED ORDER — HYDROCODONE-ACETAMINOPHEN 7.5-325 MG PO TABS: 1.0000 | ORAL_TABLET | ORAL | Status: AC | PRN

## 2012-01-17 MED ORDER — METHOCARBAMOL 500 MG PO TABS: 500.0000 mg | ORAL_TABLET | Freq: Four times a day (QID) | ORAL | Status: AC | PRN

## 2012-01-17 NOTE — Progress Notes (Signed)
Occupational Therapy Note Orders noted. Per PT, pt doing well and pt reports she doesn't feel she has any OT needs. Spoke briefly with pt and she states she is fine with her ADL. She has a standard toilet but has grab bars and she doesn't feel she needs a 3in1. She has a walk in shower. Will sign off per pt request. Judithann Sauger OTR/L [phone] 01/17/2012

## 2012-01-17 NOTE — Progress Notes (Signed)
Subjective: 1 Day Post-Op Procedure(s) (LRB): TOTAL KNEE ARTHROPLASTY (Left)   Patient reports pain as mild. Pain well controlled with medication. No events throughout the night.  Objective:   VITALS:   Filed Vitals:   01/17/12 0456  BP: 102/70  Pulse: 62  Temp: 97.7 F (36.5 C)  Resp: 14    Neurovascular intact Dorsiflexion/Plantar flexion intact Incision: dressing C/D/I No cellulitis present Compartment soft  LABS  Basename 01/17/12 0416  HGB 12.2  HCT 37.1  WBC 12.9*  PLT 261     Basename 01/17/12 0416  NA 138  K 4.0  BUN 10  CREATININE 0.63  GLUCOSE 157*     Assessment/Plan: 1 Day Post-Op Procedure(s) (LRB): TOTAL KNEE ARTHROPLASTY (Left)   HV drain d/c'ed Foley cath d/c'ed Advance diet Up with therapy D/C IV fluids Discharge home with home health when ready, possibly Thursday   Raven Mendoza S. Raven Mendoza   PAC  01/17/2012, 9:21 AM

## 2012-01-17 NOTE — Evaluation (Signed)
Physical Therapy Evaluation Patient Details Name: Raven Mendoza MRN: [id number] DOB: 11-01-1960 Today's Date: 01/17/2012 Time: 4098-1191 PT Time Calculation (min): 24 min  PT Assessment / Plan / Recommendation Clinical Impression  pt is s/p L TKA and will benefit from PT to maximize independence for home setting with family assist; pt does not feel she needs OT services at this time, OT notified     PT Assessment  Patient needs continued PT services    Follow Up Recommendations  Home health PT    Equipment Recommendations  None recommended by PT    Frequency 7X/week    Precautions / Restrictions Precautions Precautions: Knee Required Braces or Orthoses: Knee Immobilizer - Left Knee Immobilizer - Left: Discontinue once straight leg raise with < 10 degree lag Restrictions Weight Bearing Restrictions: No   Pertinent Vitals/Pain       Mobility  Bed Mobility Bed Mobility: Supine to Sit Supine to Sit: 5: Supervision Transfers Transfers: Sit to Stand;Stand to Sit Sit to Stand: 4: Min guard;From bed;With upper extremity assist Stand to Sit: 4: Min assist;Without upper extremity assist;With armrests Details for Transfer Assistance: min with LLE positioning; cues for hand placement Ambulation/Gait Ambulation/Gait Assistance: 4: Min guard Ambulation Distance (Feet): 90 Feet Assistive device: Rolling walker Ambulation/Gait Assistance Details: cues for sequence and step length Gait Pattern: Step-to pattern    Exercises Total Joint Exercises Ankle Circles/Pumps: AROM;10 reps;Both Quad Sets: AROM;5 reps;Left   PT Goals Acute Rehab PT Goals PT Goal Formulation: With patient Time For Goal Achievement: 01/22/12 Potential to Achieve Goals: Good Pt will go Supine/Side to Sit: with modified independence PT Goal: Supine/Side to Sit - Progress: Goal set today Pt will go Sit to Supine/Side: with modified independence PT Goal: Sit to Supine/Side - Progress: Goal set today Pt will  go Sit to Stand: with modified independence PT Goal: Sit to Stand - Progress: Goal set today Pt will go Stand to Sit: with modified independence PT Goal: Stand to Sit - Progress: Goal set today Pt will Ambulate: >150 feet;with modified independence;with least restrictive assistive device PT Goal: Ambulate - Progress: Goal set today Pt will Go Up / Down Stairs: with least restrictive assistive device;1-2 stairs;with rail(s) PT Goal: Up/Down Stairs - Progress: Goal set today Pt will Perform Home Exercise Program: with supervision, verbal cues required/provided PT Goal: Perform Home Exercise Program - Progress: Goal set today  Visit Information  Last PT Received On: 01/17/12 Assistance Needed: +1    Subjective Data  Subjective: i had my hip done 2 mothns ago Patient Stated Goal: decrease pain   Prior Functioning  Home Living Lives With: Family Available Help at Discharge: Family Type of Home: House Home Access: Stairs to enter Secretary/administrator of Steps: 2 Entrance Stairs-Rails: Right;Left;Can reach both Home Layout: Two level;Able to live on main level with bedroom/bathroom Bathroom Shower/Tub: Walk-in shower Home Adaptive Equipment: Walker - standard;Grab bars around toilet;Crutches;Straight cane Additional Comments: walked with one cane and one crutch at times Prior Function Level of Independence: Independent with assistive device(s)    Cognition  Overall Cognitive Status: Appears within functional limits for tasks assessed/performed Arousal/Alertness: Awake/alert Orientation Level: Appears intact for tasks assessed Behavior During Session: [facility] for tasks performed    Extremity/Trunk Assessment Right Upper Extremity Assessment RUE ROM/Strength/Tone: Within functional levels Left Upper Extremity Assessment LUE ROM/Strength/Tone: Within functional levels Right Lower Extremity Assessment RLE ROM/Strength/Tone: Within functional levels Left Lower Extremity  Assessment LLE ROM/Strength/Tone: Deficits LLE ROM/Strength/Tone Deficits: ankle WFL, able to do I  SLR; flexion limited  to approximiately 30 degrees due to pain and swelling Trunk Assessment Trunk Assessment: Normal   Balance    End of Session PT - End of Session Activity Tolerance: Patient tolerated treatment well Patient left: in chair   [facility] 01/17/2012, 9:14 AM

## 2012-01-17 NOTE — Discharge Summary (Signed)
Physician Discharge Summary  Patient ID: PAXTYN WISDOM MRN: [id number] DOB/AGE: [birth year] 53 y.o.  Admit date: 01/16/2012 Discharge date: 01/18/2012  Procedures:  Procedure(s) (LRB): TOTAL KNEE ARTHROPLASTY (Left)  Attending Physician:  Dr. Durene Romans   Admission Diagnoses: End-stage osteoarthritis, left knee  Discharge Diagnoses:  Principal Problem:  *S/P left TKA Hypothyroidism Osteoarthritis  HPI: This is a 51 year old lady with a history of end-stage osteoarthritis of her left knee, who has failed conservative management. She has had recent total hip arthroplasties with excellent response and now wishes to proceed with total knee arthroplasty on the left. The surgery, risks, benefits, and aftercare were discussed in detail with the patient. Questions invited and answered. Note her medical caregiver is Frazier Richards, [place] and specialty physician, Dr. Leslie Dales, for Endocrinology. Note that she is a candidate for tranexamic acid and dexamethasone and will receive both at surgery and she plans on going home after surgery. She is given her home medications of aspirin, iron, Robaxin, MiraLax, and Colace today.  PCP: Frazier Richards, MD, MD   Discharged Condition: good  Hospital Course:  Patient underwent the above stated procedure on 01/16/2012. Patient tolerated the procedure well and brought to the recovery room in good condition and subsequently to the floor.  POD #1 BP: 102/70 ; Pulse: 62 ; Temp: 97.7 F (36.5 C) ; Resp: 14  Pt's foley was removed, as well as the hemovac drain removed. IV was changed to a saline lock. Patient reports pain as mild. Pain well controlled with medication. No events throughout the night. Neurovascular intact, dorsiflexion/plantar flexion intact, incision: dressing C/D/I, no cellulitis present and compartment soft.   LABS  Basename  01/17/12 0416   HGB  12.2  HCT  37.1    POD #2  BP: 101/67 ; Pulse: 77 ; Temp: 97.9 F (36.6 C) ; Resp: 16    Patient reports pain as mild. Pain well controlled with medication. No events throughout the night. Ready to be discharged home. Neurovascular intact, dorsiflexion/plantar flexion intact, incision: dressing C/D/I, no cellulitis present and compartment soft.   LABS  Basename  01/18/12 0417   HGB  11.0  HCT  33.7    Discharge Exam: General appearance: alert, cooperative and no distress Extremities: Homans sign is negative, no sign of DVT, no edema, redness or tenderness in the calves or thighs and no ulcers, gangrene or trophic changes  Disposition: Home with follow up in 2 weeks  Follow-up Information    Follow up with OLIN,Bianka Liberati D in 2 weeks.   Contact information:   [facility] [address], [address] [place] [address] [phone]          Discharge Orders    Future Orders Please Complete By Expires   Diet - low sodium heart healthy      Call MD / Call 911      Comments:   If you experience chest pain or shortness of breath, CALL 911 and be transported to the hospital emergency room.  If you develope a fever above 101 F, pus (white drainage) or increased drainage or redness at the wound, or calf pain, call your surgeon's office.   Discharge instructions      Comments:   Maintain surgical dressing for 8 days, then replace with gauze and tape. Keep the area dry and clean until follow up. Follow up in 2 weeks at [facility]. Call with any questions or concerns.     Constipation Prevention  Comments:   Drink plenty of fluids.  Prune juice may be helpful.  You may use a stool softener, such as Colace (over the counter) 100 mg twice a day.  Use MiraLax (over the counter) for constipation as needed.   Increase activity slowly as tolerated      Weight Bearing as taught in Physical Therapy      Comments:   Use a walker or crutches as instructed.   Driving restrictions      Comments:   No driving for 4 weeks   TED hose       Comments:   Use stockings (TED hose) for 2 weeks on both leg(s).  You may remove them at night for sleeping.   Change dressing      Comments:   Maintain surgical dressing for 8 days, then change the dressing daily with sterile 4 x 4 inch gauze dressing and tape. Keep the area dry and clean.      Current Discharge Medication List    START taking these medications   Details  aspirin EC 325 MG tablet Take 1 tablet (325 mg total) by mouth 2 (two) times daily. X 4 weeks Qty: 60 tablet, Refills: 0    docusate sodium 100 MG CAPS Take 100 mg by mouth 2 (two) times daily.    HYDROcodone-acetaminophen (NORCO) 7.5-325 MG per tablet Take 1-2 tablets by mouth every 4 (four) hours as needed for pain. Qty: 120 tablet, Refills: 0    methocarbamol (ROBAXIN) 500 MG tablet Take 1 tablet (500 mg total) by mouth every 6 (six) hours as needed (muscle spasms).    polyethylene glycol (MIRALAX / GLYCOLAX) packet Take 17 g by mouth daily as needed.      CONTINUE these medications which have NOT CHANGED   Details  Cholecalciferol (VITAMIN D) 2000 UNITS tablet Take 2,000 Units by mouth every evening.     cycloSPORINE (RESTASIS) 0.05 % ophthalmic emulsion Place 1 drop into both eyes 2 (two) times daily.    ferrous sulfate 325 (65 FE) MG tablet Take 1 tablet (325 mg total) by mouth 3 (three) times daily after meals.    levothyroxine (SYNTHROID, LEVOTHROID) 125 MCG tablet Take 125 mcg by mouth daily before breakfast.    Polyethyl Glycol-Propyl Glycol (SYSTANE OP) Apply 1 drop to eye 2 (two) times daily as needed. Dry eyes       STOP taking these medications     meloxicam (MOBIC) 15 MG tablet Comments:  Reason for Stopping:           Signed:  Anastasio Auerbach. Bravlio Luca   PAC  01/18/2012, 9:08 AM

## 2012-01-17 NOTE — Progress Notes (Signed)
CSW consulted for SNF placement. PN reviewed. PT has recommended HH PT . RNCM will assist with d/c planning home. CSW is available if plan changes and SNF placement is needed.  Cori Razor  LCSW  [phone]

## 2012-01-17 NOTE — Progress Notes (Signed)
01/17/12 1300  PT Visit Information  Last PT Received On 01/17/12  Assistance Needed +1  PT Time Calculation  PT Start Time 1310  PT Stop Time 1338  PT Time Calculation (min) 28 min  Precautions  Precautions Knee  Knee Immobilizer - Left Discontinue once straight leg raise with < 10 degree lag  Cognition  Overall Cognitive Status Appears within functional limits for tasks assessed/performed  Arousal/Alertness Awake/alert  Orientation Level Appears intact for tasks assessed  Behavior During Session [facility] for tasks performed  Bed Mobility  Bed Mobility Supine to Sit;Sit to Supine  Supine to Sit 5: Supervision  Sit to Supine 4: Min assist  Details for Bed Mobility Assistance cues for technique, use of UEs  Transfers  Transfers Sit to Stand;Stand to Sit  Sit to Stand 4: Min guard;From bed;With upper extremity assist  Stand to Sit 4: Min guard;To bed;With upper extremity assist  Details for Transfer Assistance min with LLE positioning; cues for hand placement  Ambulation/Gait  Ambulation/Gait Assistance 4: Min guard  Ambulation Distance (Feet) 80 Feet  Assistive device Rolling walker  Ambulation/Gait Assistance Details cues for sequence and step length  Gait Pattern Step-to pattern  Total Joint Exercises  Ankle Circles/Pumps AROM;10 reps;Both  Quad Sets AROM;Left;10 reps  Short Arc [place];Left;10 reps  Heel Slides AROM;Left;10 reps  Hip ABduction/ADduction Left;10 reps;AAROM  Straight Leg Raises AROM;AAROM;Left;10 reps  PT - End of Session  Activity Tolerance Patient tolerated treatment well  Patient left in bed;with call bell/phone within reach  PT - Assessment/Plan  Comments on Treatment Session pt progressing very well  PT Plan Discharge plan remains appropriate;Frequency remains appropriate  PT Frequency 7X/week  Follow Up Recommendations Home health PT  Equipment Recommended None recommended by PT  Acute Rehab PT Goals  Time For Goal Achievement 01/22/12    Potential to Achieve Goals Good  Pt will go Supine/Side to Sit with modified independence  PT Goal: Supine/Side to Sit - Progress Progressing toward goal  Pt will go Sit to Supine/Side with modified independence  PT Goal: Sit to Supine/Side - Progress Progressing toward goal  Pt will go Sit to Stand with modified independence  PT Goal: Sit to Stand - Progress Progressing toward goal  Pt will go Stand to Sit with modified independence  PT Goal: Stand to Sit - Progress Met  Pt will Ambulate >150 feet;with modified independence;with least restrictive assistive device  PT Goal: Ambulate - Progress Progressing toward goal  Pt will Go Up / Down Stairs with min assist;with least restrictive assistive device;1-2 stairs;with rail(s)  Pt will Perform Home Exercise Program with supervision, verbal cues required/provided  PT Goal: Perform Home Exercise Program - Progress Progressing toward goal

## 2012-01-17 NOTE — Progress Notes (Signed)
CARE MANAGEMENT NOTE 01/17/2012  Patient:  Raven Mendoza, Raven Mendoza   Account Number:  [id number]  Date Initiated:  01/17/2012  Documentation initiated by:  Colleen Can  Subjective/Objective Assessment:   dx Total knee replacemnt     Action/Plan:   CM spoke with patient. Plans are for patient to return to home in [facility] where daughter and mother-in-law will be caregivers.She already has DME. She requested [facility]; has used them in the past-Wants same physical therapist if possib   Anticipated DC Date:  01/18/2012   Anticipated DC Plan:  HOME W HOME HEALTH SERVICES  In-house referral  NA      DC Planning Services  CM consult      [facility] Choice  HOME HEALTH   Choice offered to / List presented to:  C-1 Patient      DME agency  NA     HH arranged  NA      HH agency  [facility]   Status of service:  In process, will continue to follow Medicare Important Message given?   (If response is "NO", the following Medicare IM given date fields will be blank)   Comments:  01/17/2012 Raven Mendoza BSN CCM [phone] List of [facility] agencies placed in shadow chart. Face sheet, op note, H&P, oders faxed to [phone] with confirmation received. CM will f/u

## 2012-01-18 LAB — CBC
HCT: 33.7 % — ABNORMAL LOW (ref 36.0–46.0)
Hemoglobin: 11 g/dL — ABNORMAL LOW (ref 12.0–15.0)
MCHC: 32.6 g/dL (ref 30.0–36.0)
WBC: 9.5 10*3/uL (ref 4.0–10.5)

## 2012-01-18 LAB — BASIC METABOLIC PANEL
BUN: 9 mg/dL (ref 6–23)
CO2: 27 mEq/L (ref 19–32)
Chloride: 104 mEq/L (ref 96–112)
Glucose, Bld: 117 mg/dL — ABNORMAL HIGH (ref 70–99)
Potassium: 3.5 mEq/L (ref 3.5–5.1)

## 2012-01-18 NOTE — Progress Notes (Signed)
Physical Therapy Treatment Patient Details Name: Raven Mendoza MRN: [id number] DOB: 1960/12/01 Today's Date: 01/18/2012 Time: 7829-5621 PT Time Calculation (min): 24 min  PT Assessment / Plan / Recommendation Comments on Treatment Session  pt progressing very well    Follow Up Recommendations  Home health PT    Equipment Recommendations  None recommended by PT    Frequency 7X/week   Plan Discharge plan remains appropriate;Frequency remains appropriate    Precautions / Restrictions Precautions Precautions: Knee Required Braces or Orthoses: Knee Immobilizer - Left Knee Immobilizer - Left: Discontinue once straight leg raise with < 10 degree lag   Pertinent Vitals/Pain     Mobility  Transfers Transfers: Sit to Stand;Stand to Sit Sit to Stand: 6: Modified independent (Device/Increase time);With armrests;With upper extremity assist;From chair/3-in-1 Stand to Sit: To chair/3-in-1;With upper extremity assist;6: Modified independent (Device/Increase time);With armrests Ambulation/Gait Ambulation/Gait Assistance: 5: Supervision Ambulation Distance (Feet): 20 Feet Assistive device: Rolling walker Ambulation/Gait Assistance Details: cues to increase wt on LLE Gait Pattern: Step-to pattern Stairs: Yes Stairs Assistance: 4: Min assist Stairs Assistance Details (indicate cue type and reason): min for balance adn safety Stair Management Technique: Two rails;Step to pattern Number of Stairs: 2     Exercises Total Joint Exercises Ankle Circles/Pumps: AROM;10 reps;Both Quad Sets: AROM;Left;10 reps Heel Slides: AROM;Left;10 reps Straight Leg Raises: AROM;AAROM;Left;10 reps Knee Flexion: AAROM;5 reps;Left;Seated   PT Goals Acute Rehab PT Goals Time For Goal Achievement: 01/22/12 Potential to Achieve Goals: Good Pt will go Sit to Stand: with modified independence PT Goal: Sit to Stand - Progress: Met Pt will go Stand to Sit: with modified independence PT Goal: Stand to Sit -  Progress: Met Pt will Ambulate: >150 feet;with modified independence;with least restrictive assistive device PT Goal: Ambulate - Progress: Partly met Pt will Go Up / Down Stairs: with min assist;with least restrictive assistive device;1-2 stairs;with rail(s) PT Goal: Up/Down Stairs - Progress: Met Pt will Perform Home Exercise Program: with supervision, verbal cues required/provided PT Goal: Perform Home Exercise Program - Progress: Partly met  Visit Information  Last PT Received On: 01/18/12 Assistance Needed: +1    Subjective Data      Cognition  Overall Cognitive Status: Appears within functional limits for tasks assessed/performed Arousal/Alertness: Awake/alert Orientation Level: Appears intact for tasks assessed Behavior During Session: [facility] for tasks performed    Balance     End of Session PT - End of Session Equipment Utilized During Treatment: Gait belt;Left knee immobilizer Activity Tolerance: Patient tolerated treatment well Patient left: in chair;with call bell/phone within reach;with family/visitor present Nurse Communication: Other (comment) (ready for d/c)    [facility] 01/18/2012, 9:00 AM

## 2012-01-18 NOTE — Progress Notes (Signed)
CARE MANAGEMENT NOTE 01/18/2012  Patient:  ICEIS, KNAB A   Account Number:  [id number]  Date Initiated:  01/17/2012  Documentation initiated by:  Colleen Can  Subjective/Objective Assessment:   dx Total knee replacemnt     Action/Plan:   CM spoke with patient. Plans are for patient to return to home in [place] where daughter and mother-in-law will be caregivers.She already has DME. She requested [facility]; has used them in the past-Wants same physical therapist if possib   Anticipated DC Date:  01/18/2012   Anticipated DC Plan:  HOME W HOME HEALTH SERVICES  In-house referral  NA      DC Planning Services  CM consult      [facility] Choice  HOME HEALTH   Choice offered to / List presented to:  C-1 Patient   DME arranged  NA      DME agency  NA     HH arranged  HH-2 PT      HH agency  [facility]   Status of service:  Completed, signed off Medicare Important Message given?  NO (If response is "NO", the following Medicare IM given date fields will be blank) Date Medicare IM given:   Date Additional Medicare IM given:    Discharge Disposition:  HOME W HOME HEALTH SERVICES  Per UR Regulation:  Reviewed for med. necessity/level of care/duration of stay  Comments:  01/18/2012 [name] BSN CCM [phone] Pt for discharge today with [facility] in place with start od HHpt tomorrow 01/19/2012. NO DME needs at this time.

## 2012-01-18 NOTE — Progress Notes (Signed)
Subjective: 2 Days Post-Op Procedure(s) (LRB): TOTAL KNEE ARTHROPLASTY (Left)   Patient reports pain as mild. Pain well controlled with medication. No events throughout the night. Ready to be discharged home.  Objective:   VITALS:   Filed Vitals:   01/18/12 0520  BP: 101/67  Pulse: 77  Temp: 97.9 F (36.6 C)  Resp: 16    Neurovascular intact Dorsiflexion/Plantar flexion intact Incision: dressing C/D/I No cellulitis present Compartment soft  LABS  Basename 01/18/12 0417 01/17/12 0416  HGB 11.0* 12.2  HCT 33.7* 37.1  WBC 9.5 12.9*  PLT 218 261     Basename 01/18/12 0417 01/17/12 0416  NA 138 138  K 3.5 4.0  BUN 9 10  CREATININE 0.63 0.63  GLUCOSE 117* 157*     Assessment/Plan: 2 Days Post-Op Procedure(s) (LRB): TOTAL KNEE ARTHROPLASTY (Left)   Up with therapy Discharge home with home health Follow up in 2 weeks at [facility].  Follow-up Information    Follow up with OLIN,Batoul Limes D in 2 weeks.   Contact information:   [facility] [address], [address] [place] [address] [phone]          Raven Mendoza   PAC  01/18/2012, 9:05 AM

## 2012-03-11 ENCOUNTER — Encounter (HOSPITAL_COMMUNITY): Payer: Self-pay | Admitting: Pharmacy Technician

## 2012-03-19 ENCOUNTER — Inpatient Hospital Stay (HOSPITAL_COMMUNITY): Admission: RE | Admit: 2012-03-19 | Payer: No Typology Code available for payment source | Source: Ambulatory Visit

## 2012-03-20 ENCOUNTER — Encounter (HOSPITAL_COMMUNITY)
Admission: RE | Admit: 2012-03-20 | Discharge: 2012-03-20 | Disposition: A | Discharge status: Home / Self Care | Payer: No Typology Code available for payment source | Source: Ambulatory Visit | Attending: Orthopedic Surgery | Admitting: Orthopedic Surgery

## 2012-03-20 ENCOUNTER — Encounter (HOSPITAL_COMMUNITY): Payer: Self-pay

## 2012-03-20 LAB — URINALYSIS, ROUTINE W REFLEX MICROSCOPIC
Bilirubin Urine: NEGATIVE
Ketones, ur: NEGATIVE mg/dL
Nitrite: NEGATIVE
Urobilinogen, UA: 0.2 mg/dL (ref 0.0–1.0)

## 2012-03-20 LAB — BASIC METABOLIC PANEL
BUN: 17 mg/dL (ref 6–23)
GFR calc Af Amer: >90 mL/min (ref >90)
GFR calc non Af Amer: >90 mL/min (ref >90)
Potassium: 3.8 mEq/L (ref 3.5–5.1)

## 2012-03-20 LAB — PROTIME-INR
INR: 0.95 (ref 0.00–1.49)
Prothrombin Time: 12.9 seconds (ref 11.6–15.2)

## 2012-03-20 LAB — CBC
Hemoglobin: 13.1 g/dL (ref 12.0–15.0)
MCHC: 32.8 g/dL (ref 30.0–36.0)
RDW: 14.4 % (ref 11.5–15.5)
WBC: 7.7 10*3/uL (ref 4.0–10.5)

## 2012-03-20 LAB — APTT: aPTT: 35 seconds (ref 24–37)

## 2012-03-20 NOTE — Pre-Procedure Instructions (Signed)
EKG 12/12 on chart

## 2012-03-20 NOTE — Patient Instructions (Addendum)
[address] ELVETA RAPE  03/20/2012   Your procedure is scheduled on:  03/26/12  Tuesday surgery 0900-1010  Report to [facility] at  0630    AM.  Call this number if you have problems the morning of surgery: [phone]     Or PST   [id number]  [facility]   Remember:   Do not eat food or drink any fluids :After Midnight. Monday NIGHT    Take these medicines the morning of surgery with A SIP OF WATER: Synthroid       Norco if needed   Do not wear jewelry, make-up or nail polish.  Do not wear lotions, powders, or perfumes. You may wear deodorant.  Do not shave 48 hours prior to surgery.  Do not bring valuables to the hospital.  Contacts, dentures or bridgework may not be worn into surgery.  Leave suitcase in the car. After surgery it may be brought to your room.  For patients admitted to the hospital, checkout time is 11:00 AM the day of discharge.   Patients discharged the day of surgery will not be allowed to drive home.  Name and phone number of your driver:    daughter                                                                  Special Instructions: CHG Shower Use Special Wash: 1/2 bottle night before surgery and 1/2 bottle morning of surgery. REGULAR SOAP FACE AND PRIVATES              LADIES- NO SHAVING 48 HOURS BEFORE USING BETASEPT SOAP.                 Please read over the following fact sheets that you were given: MRSA Information

## 2012-03-21 NOTE — H&P (Signed)
NAMESHANNAN, SLINKER                ACCOUNT NO.:  [id number]  MEDICAL RECORD NO.:  [id number]  LOCATION:  [facility]                         FACILITY:  [facility]  PHYSICIAN:  Madlyn Frankel. Charlann Boxer, M.D.  DATE OF BIRTH:  Apr 28, 1961  DATE OF ADMISSION:  03/20/2012 DATE OF DISCHARGE:                             HISTORY & PHYSICAL   DATE OF SURGERY:  March 26, 2012.  ADMITTING DIAGNOSES:  Osteoarthritis right knee and status post total knee arthroplasty left knee with stiffness.  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 She also has osteoarthritis of her right knee, for which she is going to be undergoing a total knee arthroplasty of the right knee.  She will also undergo manipulation under anesthesia of her left knee to increase her flexion.  The surgery risks, benefits, and aftercare were detailed with the patient.  Questions were invited and answered.  Note that her medical caregiver is Geanie Logan, [place], and specialty physician, Dr. Leslie Dales, for endocrinology.  Note that she is a candidate for tranexamic acid and dexamethasone, will receive both at surgery. She is given her home medications of aspirin, iron, Robaxin, MiraLAX, and Colace, and again plans on going home after surgery.  PAST MEDICAL HISTORY:  Drug allergies to PENICILLIN with rash and swelling.  CURRENT MEDICATIONS:  Synthroid, hydrocodone, she has stopped her meloxicam.  PREVIOUS SURGERIES:  C-section x2.  Total hip arthroplasty and left total knee arthroplasty.  SERIOUS MEDICAL ILLNESSES:  Hypothyroidism and osteoarthritis.  FAMILY HISTORY:  Positive for stroke.  SOCIAL HISTORY:  The patient is married.  She lives at home.  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  REVIEW OF SYSTEMS:  CENTRAL  NERVOUS SYSTEM: Negative for headache, blurred vision, or dizziness.  PULMONARY: Negative for shortness breath, PND, or orthopnea.  CARDIOVASCULAR: Negative for chest pain, palpitation.  GI: Negative for ulcers or hepatitis.  GU: Negative for urinary tract difficulty.  MUSCULOSKELETAL: Positive as in HPI.  PHYSICAL EXAMINATION:  VITAL SIGNS: Blood pressure 129/72, pulse 72 and regular, respirations 14. HEENT: Head normocephalic.  Nose patent.  Ears patent.  Pupils equal, round, and reactive to light.  Throat without injection. NECK: Supple without adenopathy.  Carotids 2+ without bruit. CHEST: Clear to auscultation.  No rales, rhonchi, respirations 14. HEART: Regular rate and rhythm at 72 beats per minute without murmur. ABDOMEN: Soft.  Active bowel sounds.  No masses or organomegaly. NEUROLOGIC: Patient is alert and oriented to time, place, and person. Cranial nerves II through XII grossly intact. EXTREMITIES: Shows left knee status post total knee arthroplasty with full extension further flexion to 75 degrees with a firm end point.  No calf tenderness.  No cords.  Negative Homans sign. NEUROVASCULAR STATUS: Intact.  Right knee shows a 5 degree flexion traction with further flexion 75 degrees.  Neurovascular status intact.  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  PLAN:  Total knee  arthroplasty right knee and manipulation under anesthesia left knee.     Jaquelyn Bitter. Carrin Vannostrand, P.A.   ______________________________ Madlyn Frankel Charlann Boxer, M.D.    SJC/MEDQ  D:  03/20/2012  T:  03/20/2012  Job:  [id number]

## 2012-03-26 ENCOUNTER — Encounter (HOSPITAL_COMMUNITY): Payer: Self-pay | Admitting: Anesthesiology

## 2012-03-26 ENCOUNTER — Ambulatory Visit (HOSPITAL_COMMUNITY): Payer: No Typology Code available for payment source | Admitting: Anesthesiology

## 2012-03-26 ENCOUNTER — Inpatient Hospital Stay (HOSPITAL_COMMUNITY)
Admission: RE | Admit: 2012-03-26 | Discharge: 2012-03-28 | DRG: 470 | Disposition: A | Discharge status: Home Health | Payer: No Typology Code available for payment source | Source: Ambulatory Visit | Attending: Orthopedic Surgery | Admitting: Orthopedic Surgery

## 2012-03-26 ENCOUNTER — Encounter (HOSPITAL_COMMUNITY): Payer: Self-pay | Admitting: *Deleted

## 2012-03-26 ENCOUNTER — Encounter (HOSPITAL_COMMUNITY)
Admission: RE | Disposition: A | Discharge status: Home Health | Payer: Self-pay | Source: Ambulatory Visit | Attending: Orthopedic Surgery

## 2012-03-26 DIAGNOSIS — T8489XA Other specified complication of internal orthopedic prosthetic devices, implants and grafts, initial encounter: Secondary | ICD-10-CM | POA: Diagnosis present

## 2012-03-26 DIAGNOSIS — Z96659 Presence of unspecified artificial knee joint: Secondary | ICD-10-CM

## 2012-03-26 DIAGNOSIS — E039 Hypothyroidism, unspecified: Secondary | ICD-10-CM | POA: Diagnosis present

## 2012-03-26 DIAGNOSIS — M1710 Unilateral primary osteoarthritis, unspecified knee: Principal | ICD-10-CM | POA: Diagnosis present

## 2012-03-26 DIAGNOSIS — Y831 Surgical operation with implant of artificial internal device as the cause of abnormal reaction of the patient, or of later complication, without mention of misadventure at the time of the procedure: Secondary | ICD-10-CM | POA: Diagnosis present

## 2012-03-26 DIAGNOSIS — M25669 Stiffness of unspecified knee, not elsewhere classified: Secondary | ICD-10-CM | POA: Diagnosis present

## 2012-03-26 HISTORY — PX: TOTAL KNEE ARTHROPLASTY: SHX125

## 2012-03-26 HISTORY — PX: KNEE CLOSED REDUCTION: SHX995

## 2012-03-26 SURGERY — ARTHROPLASTY, KNEE, TOTAL
Anesthesia: Spinal | Site: Knee | Laterality: Right | Wound class: Clean

## 2012-03-26 MED ORDER — KETOROLAC TROMETHAMINE 30 MG/ML IJ SOLN
INTRAMUSCULAR | Status: DC | PRN
  Administered 2012-03-26: 30 mg

## 2012-03-26 MED ORDER — POLYETHYLENE GLYCOL 3350 17 G PO PACK
17.0000 g | PACK | Freq: Two times a day (BID) | ORAL | Status: DC
  Administered 2012-03-26 – 2012-03-27 (×3): 17 g via ORAL

## 2012-03-26 MED ORDER — MENTHOL 3 MG MT LOZG: 1.0000 | LOZENGE | OROMUCOSAL | Status: DC | PRN

## 2012-03-26 MED ORDER — ONDANSETRON HCL 4 MG/2ML IJ SOLN
INTRAMUSCULAR | Status: DC | PRN
  Administered 2012-03-26: 4 mg via INTRAVENOUS

## 2012-03-26 MED ORDER — RIVAROXABAN 10 MG PO TABS
10.0000 mg | ORAL_TABLET | ORAL | Status: DC
  Administered 2012-03-28: 10 mg via ORAL
  Filled 2012-03-26 (×2): qty 1

## 2012-03-26 MED ORDER — HYDROCODONE-ACETAMINOPHEN 7.5-325 MG PO TABS
1.0000 | ORAL_TABLET | ORAL | Status: DC
  Administered 2012-03-26 – 2012-03-27 (×3): 1 via ORAL
  Administered 2012-03-27 – 2012-03-28 (×8): 2 via ORAL
  Filled 2012-03-26 (×2): qty 2
  Filled 2012-03-26 (×5): qty 1
  Filled 2012-03-26 (×4): qty 2
  Filled 2012-03-26: qty 1
  Filled 2012-03-26: qty 2

## 2012-03-26 MED ORDER — 0.9 % SODIUM CHLORIDE (POUR BTL) OPTIME
TOPICAL | Status: DC | PRN
  Administered 2012-03-26: 1000 mL

## 2012-03-26 MED ORDER — DEXAMETHASONE SODIUM PHOSPHATE 10 MG/ML IJ SOLN
10.0000 mg | Freq: Once | INTRAMUSCULAR | Status: AC
  Administered 2012-03-27: 10 mg via INTRAVENOUS
  Filled 2012-03-26: qty 1

## 2012-03-26 MED ORDER — TRANEXAMIC ACID 100 MG/ML IV SOLN
1370.0000 mg | Freq: Once | INTRAVENOUS | Status: AC
  Administered 2012-03-26: 1370 mg via INTRAVENOUS
  Filled 2012-03-26: qty 13.7

## 2012-03-26 MED ORDER — ONDANSETRON HCL 4 MG/2ML IJ SOLN: 4.0000 mg | Freq: Four times a day (QID) | INTRAMUSCULAR | Status: DC | PRN

## 2012-03-26 MED ORDER — LACTATED RINGERS IV SOLN: INTRAVENOUS | Status: DC

## 2012-03-26 MED ORDER — ALUM & MAG HYDROXIDE-SIMETH 200-200-20 MG/5ML PO SUSP: 30.0000 mL | ORAL | Status: DC | PRN

## 2012-03-26 MED ORDER — PROPOFOL 10 MG/ML IV EMUL
INTRAVENOUS | Status: DC | PRN
  Administered 2012-03-26: 50 ug/kg/min via INTRAVENOUS

## 2012-03-26 MED ORDER — FLEET ENEMA 7-19 GM/118ML RE ENEM: 1.0000 | ENEMA | Freq: Once | RECTAL | Status: AC | PRN

## 2012-03-26 MED ORDER — ONDANSETRON HCL 4 MG PO TABS: 4.0000 mg | ORAL_TABLET | Freq: Four times a day (QID) | ORAL | Status: DC | PRN

## 2012-03-26 MED ORDER — BUPIVACAINE-EPINEPHRINE PF 0.25-1:200000 % IJ SOLN
INTRAMUSCULAR | Status: DC | PRN
  Administered 2012-03-26: 60 mL

## 2012-03-26 MED ORDER — BUPIVACAINE-EPINEPHRINE PF 0.25-1:200000 % IJ SOLN
INTRAMUSCULAR | Status: AC
  Filled 2012-03-26: qty 60

## 2012-03-26 MED ORDER — SODIUM CHLORIDE 0.9 % IV SOLN
INTRAVENOUS | Status: AC
  Administered 2012-03-26 – 2012-03-27 (×2): via INTRAVENOUS
  Filled 2012-03-26 (×7): qty 1000

## 2012-03-26 MED ORDER — KETOROLAC TROMETHAMINE 30 MG/ML IJ SOLN
INTRAMUSCULAR | Status: AC
  Filled 2012-03-26: qty 1

## 2012-03-26 MED ORDER — HYDROMORPHONE HCL PF 1 MG/ML IJ SOLN
INTRAMUSCULAR | Status: AC
  Filled 2012-03-26: qty 35

## 2012-03-26 MED ORDER — BUPIVACAINE HCL 0.75 % IJ SOLN
INTRAMUSCULAR | Status: DC | PRN
  Administered 2012-03-26: 15 mg via INTRATHECAL

## 2012-03-26 MED ORDER — PHENOL 1.4 % MT LIQD: 1.0000 | OROMUCOSAL | Status: DC | PRN

## 2012-03-26 MED ORDER — HYDROMORPHONE HCL PF 1 MG/ML IJ SOLN
0.5000 mg | INTRAMUSCULAR | Status: DC | PRN
Start: 2012-03-26 — End: 2012-03-28

## 2012-03-26 MED ORDER — ZOLPIDEM TARTRATE 5 MG PO TABS: 5.0000 mg | ORAL_TABLET | Freq: Every evening | ORAL | Status: DC | PRN

## 2012-03-26 MED ORDER — LEVOTHYROXINE SODIUM 125 MCG PO TABS
125.0000 ug | ORAL_TABLET | Freq: Every day | ORAL | Status: DC
  Administered 2012-03-27 – 2012-03-28 (×2): 125 ug via ORAL
  Filled 2012-03-26 (×4): qty 1

## 2012-03-26 MED ORDER — LACTATED RINGERS IV SOLN
INTRAVENOUS | Status: DC
  Administered 2012-03-26: 1000 mL via INTRAVENOUS

## 2012-03-26 MED ORDER — CLINDAMYCIN PHOSPHATE 600 MG/50ML IV SOLN
600.0000 mg | INTRAVENOUS | Status: AC
  Administered 2012-03-26: 600 mg via INTRAVENOUS
  Filled 2012-03-26: qty 50

## 2012-03-26 MED ORDER — HYDROMORPHONE HCL PF 1 MG/ML IJ SOLN
0.2500 mg | INTRAMUSCULAR | Status: DC | PRN
  Administered 2012-03-26 (×2): 0.5 mg via INTRAVENOUS

## 2012-03-26 MED ORDER — SODIUM CHLORIDE 0.9 % IR SOLN
Status: DC | PRN
  Administered 2012-03-26: 3000 mL

## 2012-03-26 MED ORDER — DIPHENHYDRAMINE HCL 25 MG PO CAPS: 25.0000 mg | ORAL_CAPSULE | Freq: Four times a day (QID) | ORAL | Status: DC | PRN

## 2012-03-26 MED ORDER — ACETAMINOPHEN 10 MG/ML IV SOLN
INTRAVENOUS | Status: AC
  Filled 2012-03-26: qty 100

## 2012-03-26 MED ORDER — BISACODYL 5 MG PO TBEC: 5.0000 mg | DELAYED_RELEASE_TABLET | Freq: Every day | ORAL | Status: DC | PRN

## 2012-03-26 MED ORDER — METOCLOPRAMIDE HCL 5 MG/ML IJ SOLN: 5.0000 mg | Freq: Three times a day (TID) | INTRAMUSCULAR | Status: DC | PRN

## 2012-03-26 MED ORDER — METHOCARBAMOL 500 MG PO TABS
500.0000 mg | ORAL_TABLET | Freq: Four times a day (QID) | ORAL | Status: DC | PRN
  Administered 2012-03-27: 500 mg via ORAL
  Filled 2012-03-26: qty 1

## 2012-03-26 MED ORDER — CLINDAMYCIN PHOSPHATE 600 MG/50ML IV SOLN
600.0000 mg | Freq: Four times a day (QID) | INTRAVENOUS | Status: AC
  Administered 2012-03-26: 600 mg via INTRAVENOUS
  Filled 2012-03-26 (×3): qty 50

## 2012-03-26 MED ORDER — METHOCARBAMOL 100 MG/ML IJ SOLN
500.0000 mg | Freq: Four times a day (QID) | INTRAVENOUS | Status: DC | PRN
  Administered 2012-03-26 – 2012-03-27 (×3): 500 mg via INTRAVENOUS
  Filled 2012-03-26 (×3): qty 5

## 2012-03-26 MED ORDER — KETOROLAC TROMETHAMINE 15 MG/ML IJ SOLN
INTRAMUSCULAR | Status: AC
  Filled 2012-03-26: qty 1

## 2012-03-26 MED ORDER — RIVAROXABAN 10 MG PO TABS
10.0000 mg | ORAL_TABLET | Freq: Once | ORAL | Status: AC
  Administered 2012-03-27: 10 mg via ORAL
  Filled 2012-03-26: qty 1

## 2012-03-26 MED ORDER — MIDAZOLAM HCL 5 MG/5ML IJ SOLN
INTRAMUSCULAR | Status: DC | PRN
  Administered 2012-03-26: 2 mg via INTRAVENOUS

## 2012-03-26 MED ORDER — DOCUSATE SODIUM 100 MG PO CAPS
100.0000 mg | ORAL_CAPSULE | Freq: Two times a day (BID) | ORAL | Status: DC
  Administered 2012-03-26 – 2012-03-28 (×4): 100 mg via ORAL

## 2012-03-26 MED ORDER — ACETAMINOPHEN 10 MG/ML IV SOLN
INTRAVENOUS | Status: DC | PRN
  Administered 2012-03-26: 1000 mg via INTRAVENOUS

## 2012-03-26 MED ORDER — KETOROLAC TROMETHAMINE 15 MG/ML IJ SOLN
15.0000 mg | Freq: Four times a day (QID) | INTRAMUSCULAR | Status: AC
  Administered 2012-03-26 – 2012-03-27 (×4): 15 mg via INTRAVENOUS
  Filled 2012-03-26 (×3): qty 1

## 2012-03-26 MED ORDER — FERROUS SULFATE 325 (65 FE) MG PO TABS
325.0000 mg | ORAL_TABLET | Freq: Three times a day (TID) | ORAL | Status: DC
  Administered 2012-03-26 – 2012-03-28 (×4): 325 mg via ORAL
  Filled 2012-03-26 (×8): qty 1

## 2012-03-26 MED ORDER — CYCLOSPORINE 0.05 % OP EMUL
1.0000 [drp] | Freq: Two times a day (BID) | OPHTHALMIC | Status: DC
Start: 2012-03-26 — End: 2012-03-28
  Administered 2012-03-26 – 2012-03-28 (×4): 1 [drp] via OPHTHALMIC
  Filled 2012-03-26 (×6): qty 1

## 2012-03-26 MED ORDER — METOCLOPRAMIDE HCL 10 MG PO TABS: 5.0000 mg | ORAL_TABLET | Freq: Three times a day (TID) | ORAL | Status: DC | PRN

## 2012-03-26 MED ORDER — DEXAMETHASONE SODIUM PHOSPHATE 10 MG/ML IJ SOLN
10.0000 mg | Freq: Once | INTRAMUSCULAR | Status: AC
  Administered 2012-03-26: 10 mg via INTRAVENOUS

## 2012-03-26 SURGICAL SUPPLY — 60 items
BAG ZIPLOCK 12X15 (MISCELLANEOUS) ×3 IMPLANT
BANDAGE ADHESIVE 1X3 (GAUZE/BANDAGES/DRESSINGS) IMPLANT
BANDAGE ELASTIC 6 VELCRO ST LF (GAUZE/BANDAGES/DRESSINGS) ×3 IMPLANT
BANDAGE ESMARK 6X9 LF (GAUZE/BANDAGES/DRESSINGS) ×2 IMPLANT
BLADE SAW SGTL 13.0X1.19X90.0M (BLADE) ×3 IMPLANT
BNDG ESMARK 6X9 LF (GAUZE/BANDAGES/DRESSINGS) ×3
BOWL SMART MIX CTS (DISPOSABLE) ×3 IMPLANT
CEMENT HV SMART SET (Cement) ×6 IMPLANT
CLOTH BEACON ORANGE TIMEOUT ST (SAFETY) ×3 IMPLANT
CUFF TOURN SGL QUICK 34 (TOURNIQUET CUFF) ×1
CUFF TRNQT CYL 34X4X40X1 (TOURNIQUET CUFF) ×2 IMPLANT
DECANTER SPIKE VIAL GLASS SM (MISCELLANEOUS) ×3 IMPLANT
DERMABOND ADVANCED (GAUZE/BANDAGES/DRESSINGS) ×1
DERMABOND ADVANCED .7 DNX12 (GAUZE/BANDAGES/DRESSINGS) ×2 IMPLANT
DRAPE EXTREMITY T 121X128X90 (DRAPE) ×3 IMPLANT
DRAPE POUCH INSTRU U-SHP 10X18 (DRAPES) ×3 IMPLANT
DRAPE U-SHAPE 47X51 STRL (DRAPES) ×3 IMPLANT
DRSG AQUACEL AG ADV 3.5X10 (GAUZE/BANDAGES/DRESSINGS) ×3 IMPLANT
DRSG TEGADERM 4X4.75 (GAUZE/BANDAGES/DRESSINGS) ×3 IMPLANT
DURAPREP 26ML APPLICATOR (WOUND CARE) ×3 IMPLANT
ELECT REM PT RETURN 9FT ADLT (ELECTROSURGICAL) ×3
ELECTRODE REM PT RTRN 9FT ADLT (ELECTROSURGICAL) ×2 IMPLANT
EVACUATOR 1/8 PVC DRAIN (DRAIN) ×3 IMPLANT
FACESHIELD LNG OPTICON STERILE (SAFETY) ×15 IMPLANT
GAUZE SPONGE 2X2 8PLY STRL LF (GAUZE/BANDAGES/DRESSINGS) ×2 IMPLANT
GLOVE BIOGEL PI IND STRL 7.5 (GLOVE) ×2 IMPLANT
GLOVE BIOGEL PI IND STRL 8 (GLOVE) ×2 IMPLANT
GLOVE BIOGEL PI INDICATOR 7.5 (GLOVE) ×1
GLOVE BIOGEL PI INDICATOR 8 (GLOVE) ×1
GLOVE ECLIPSE 8.0 STRL XLNG CF (GLOVE) ×3 IMPLANT
GLOVE ORTHO TXT STRL SZ7.5 (GLOVE) ×6 IMPLANT
GLOVE SURG ORTHO 8.0 STRL STRW (GLOVE) ×3 IMPLANT
GOWN BRE IMP PREV XXLGXLNG (GOWN DISPOSABLE) ×6 IMPLANT
GOWN STRL NON-REIN LRG LVL3 (GOWN DISPOSABLE) ×3 IMPLANT
HANDPIECE INTERPULSE COAX TIP (DISPOSABLE) ×1
IMMOBILIZER KNEE 20 (SOFTGOODS) ×3
IMMOBILIZER KNEE 20 THIGH 36 (SOFTGOODS) ×2 IMPLANT
KIT BASIN OR (CUSTOM PROCEDURE TRAY) ×3 IMPLANT
MANIFOLD NEPTUNE II (INSTRUMENTS) ×3 IMPLANT
NDL SAFETY ECLIPSE 18X1.5 (NEEDLE) ×2 IMPLANT
NEEDLE HYPO 18GX1.5 SHARP (NEEDLE) ×1
NS IRRIG 1000ML POUR BTL (IV SOLUTION) ×3 IMPLANT
PACK TOTAL JOINT (CUSTOM PROCEDURE TRAY) ×3 IMPLANT
POSITIONER SURGICAL ARM (MISCELLANEOUS) ×3 IMPLANT
SET HNDPC FAN SPRY TIP SCT (DISPOSABLE) ×2 IMPLANT
SET PAD KNEE POSITIONER (MISCELLANEOUS) ×3 IMPLANT
SPONGE GAUZE 2X2 STER 10/PKG (GAUZE/BANDAGES/DRESSINGS) ×1
SPONGE GAUZE 4X4 12PLY (GAUZE/BANDAGES/DRESSINGS) IMPLANT
SUCTION FRAZIER 12FR DISP (SUCTIONS) ×3 IMPLANT
SUT MNCRL AB 4-0 PS2 18 (SUTURE) ×3 IMPLANT
SUT VIC AB 1 CT1 36 (SUTURE) ×3 IMPLANT
SUT VIC AB 2-0 CT1 27 (SUTURE) ×2
SUT VIC AB 2-0 CT1 TAPERPNT 27 (SUTURE) ×4 IMPLANT
SUT VLOC 180 0 24IN GS25 (SUTURE) ×3 IMPLANT
SYR 50ML LL SCALE MARK (SYRINGE) ×3 IMPLANT
SYR CONTROL 10ML LL (SYRINGE) IMPLANT
TOWEL OR 17X26 10 PK STRL BLUE (TOWEL DISPOSABLE) ×6 IMPLANT
TRAY FOLEY CATH 14FRSI W/METER (CATHETERS) ×3 IMPLANT
WATER STERILE IRR 1500ML POUR (IV SOLUTION) ×3 IMPLANT
WRAP KNEE MAXI GEL POST OP (GAUZE/BANDAGES/DRESSINGS) ×3 IMPLANT

## 2012-03-26 NOTE — H&P (View-Only) (Signed)
NAME:  Raven Mendoza, Raven Mendoza                ACCOUNT NO.:  [id number]  MEDICAL RECORD NO.:  [id number]  LOCATION:  [facility]                         FACILITY:  [facility]  PHYSICIAN:  Matthew D. Olin, M.D.  DATE OF BIRTH:  03/02/1961  DATE OF ADMISSION:  03/20/2012 DATE OF DISCHARGE:                             HISTORY & PHYSICAL   DATE OF SURGERY:  March 26, 2012.  ADMITTING DIAGNOSES:  Osteoarthritis right knee and status post total knee arthroplasty left knee with stiffness.  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 She also has osteoarthritis of her right knee, for which she is going to be undergoing a total knee arthroplasty of the right knee.  She will also undergo manipulation under anesthesia of her left knee to increase her flexion.  The surgery risks, benefits, and aftercare were detailed with the patient.  Questions were invited and answered.  Note that her medical caregiver is Glennie Dickson, [place], and specialty physician, Dr. Altheimer, for endocrinology.  Note that she is a candidate for tranexamic acid and dexamethasone, will receive both at surgery. She is given her home medications of aspirin, iron, Robaxin, MiraLAX, and Colace, and again plans on going home after surgery.  PAST MEDICAL HISTORY:  Drug allergies to PENICILLIN with rash and swelling.  CURRENT MEDICATIONS:  Synthroid, hydrocodone, she has stopped her meloxicam.  PREVIOUS SURGERIES:  C-section x2.  Total hip arthroplasty and left total knee arthroplasty.  SERIOUS MEDICAL ILLNESSES:  Hypothyroidism and osteoarthritis.  FAMILY HISTORY:  Positive for stroke.  SOCIAL HISTORY:  The patient is married.  She lives at home.  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  REVIEW OF SYSTEMS:  CENTRAL  NERVOUS SYSTEM: Negative for headache, blurred vision, or dizziness.  PULMONARY: Negative for shortness breath, PND, or orthopnea.  CARDIOVASCULAR: Negative for chest pain, palpitation.  GI: Negative for ulcers or hepatitis.  GU: Negative for urinary tract difficulty.  MUSCULOSKELETAL: Positive as in HPI.  PHYSICAL EXAMINATION:  VITAL SIGNS: Blood pressure 129/72, pulse 72 and regular, respirations 14. HEENT: Head normocephalic.  Nose patent.  Ears patent.  Pupils equal, round, and reactive to light.  Throat without injection. NECK: Supple without adenopathy.  Carotids 2+ without bruit. CHEST: Clear to auscultation.  No rales, rhonchi, respirations 14. HEART: Regular rate and rhythm at 72 beats per minute without murmur. ABDOMEN: Soft.  Active bowel sounds.  No masses or organomegaly. NEUROLOGIC: Patient is alert and oriented to time, place, and person. Cranial nerves II through XII grossly intact. EXTREMITIES: Shows left knee status post total knee arthroplasty with full extension further flexion to 75 degrees with a firm end point.  No calf tenderness.  No cords.  Negative Homans sign. NEUROVASCULAR STATUS: Intact.  Right knee shows a 5 degree flexion traction with further flexion 75 degrees.  Neurovascular status intact.  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  PLAN:  Total knee   arthroplasty right knee and manipulation under anesthesia left knee.     Jules Baty J. Maurya Nethery, P.A.   ______________________________ Matthew D. Olin, M.D.    SJC/MEDQ  D:  03/20/2012  T:  03/20/2012  Job:  [id number] 

## 2012-03-26 NOTE — Anesthesia Preprocedure Evaluation (Addendum)
Anesthesia Evaluation  Patient identified by MRN, date of birth, ID band Patient awake    Reviewed: Allergy & Precautions, H&P , NPO status , Patient's Chart, lab work & pertinent test results  Airway Mallampati: II TM Distance: >3 FB Neck ROM: full    Dental No notable dental hx. (+) Teeth Intact and Dental Advisory Given   Pulmonary neg pulmonary ROS,  breath sounds clear to auscultation  Pulmonary exam normal       Cardiovascular Exercise Tolerance: Good negative cardio ROS  Rhythm:regular Rate:Normal     Neuro/Psych negative neurological ROS  negative psych ROS   GI/Hepatic negative GI ROS, Neg liver ROS,   Endo/Other  negative endocrine ROSHypothyroidism   Renal/GU negative Renal ROS  negative genitourinary   Musculoskeletal   Abdominal   Peds  Hematology negative hematology ROS (+)   Anesthesia Other Findings   Reproductive/Obstetrics negative OB ROS                           Anesthesia Physical Anesthesia Plan  ASA: II  Anesthesia Plan: Spinal   Post-op Pain Management:    Induction:   Airway Management Planned:   Additional Equipment:   Intra-op Plan:   Post-operative Plan:   Informed Consent: I have reviewed the patients History and Physical, chart, labs and discussed the procedure including the risks, benefits and alternatives for the proposed anesthesia with the patient or authorized representative who has indicated his/her understanding and acceptance.   Dental Advisory Given  Plan Discussed with: CRNA and Surgeon  Anesthesia Plan Comments:         Anesthesia Quick Evaluation

## 2012-03-26 NOTE — Transfer of Care (Signed)
Immediate Anesthesia Transfer of Care Note  Patient: Raven Mendoza  Procedure(s) Performed: Procedure(s) (LRB): TOTAL KNEE ARTHROPLASTY (Right) CLOSED MANIPULATION KNEE (Left)  Patient Location: PACU  Anesthesia Type: Spinal  Level of Consciousness: sedated, patient cooperative and responds to stimulaton  Airway & Oxygen Therapy: Patient Spontanous Breathing and Patient connected to face mask oxgen  Post-op Assessment: Report given to PACU RN and Post -op Vital signs reviewed and stable  Post vital signs: Reviewed and stable  Complications: No apparent anesthesia complications

## 2012-03-26 NOTE — Anesthesia Postprocedure Evaluation (Signed)
  Anesthesia Post-op Note  Patient: Raven Mendoza  Procedure(s) Performed: Procedure(s) (LRB): TOTAL KNEE ARTHROPLASTY (Right) CLOSED MANIPULATION KNEE (Left)  Patient Location: PACU  Anesthesia Type: Spinal  Level of Consciousness: awake and alert   Airway and Oxygen Therapy: Patient Spontanous Breathing  Post-op Pain: mild  Post-op Assessment: Post-op Vital signs reviewed, Patient's Cardiovascular Status Stable, Respiratory Function Stable, Patent Airway and No signs of Nausea or vomiting  Post-op Vital Signs: stable  Complications: No apparent anesthesia complications

## 2012-03-26 NOTE — Brief Op Note (Signed)
03/26/2012  12:19 PM  PATIENT:  Raven Mendoza  51 y.o. female  PRE-OPERATIVE DIAGNOSIS:   Left knee arthrofibrosis   POST-OPERATIVE DIAGNOSIS:  Left knee arthrofibrosis  PROCEDURE:  Procedure(s) (LRB):  CLOSED MANIPULATION KNEE (Left)  SURGEON:  Surgeon(s) and Role:    * Shelda Pal, MD - Primary  PHYSICIAN ASSISTANT: None  ANESTHESIA:   spinal  EBL:  Total I/O In: 1913.7 [I.V.:1800; IV Piggyback:113.7] Out: 805 [Urine:700; Drains:30; Blood:75]  BLOOD ADMINISTERED:none  DRAINS: none   LOCAL MEDICATIONS USED:  NONE  SPECIMEN:  No Specimen  DISPOSITION OF SPECIMEN:  N/A  COUNTS:  YES  TOURNIQUET:    DICTATION: .Other Dictation: Dictation Number [id number]  PLAN OF CARE: Admit to inpatient   PATIENT DISPOSITION:  PACU - hemodynamically stable.   Delay start of Pharmacological VTE agent (>24hrs) due to surgical blood loss or risk of bleeding: no

## 2012-03-26 NOTE — Interval H&P Note (Signed)
History and Physical Interval Note:  03/26/2012 7:23 AM  Raven Mendoza  has presented today for surgery, with the diagnosis of osteoarthritis right knee and left knee arthrofibrosis   The various methods of treatment have been discussed with the patient and family. After consideration of risks, benefits and other options for treatment, the patient has consented to  Procedure(s) (LRB): TOTAL KNEE ARTHROPLASTY (Right) CLOSED MANIPULATION KNEE (Left) as a surgical intervention .  The patient's history has been reviewed, patient examined, no change in status, stable for surgery.  I have reviewed the patients' chart and labs.  Questions were answered to the patient's satisfaction.     Shelda Pal

## 2012-03-26 NOTE — Anesthesia Procedure Notes (Signed)
Spinal  Patient location during procedure: OR Start time: 03/26/2012 9:08 AM End time: 03/26/2012 9:13 AM Staffing Anesthesiologist: Ronelle Nigh L Performed by: anesthesiologist  Preanesthetic Checklist Completed: patient identified, site marked, surgical consent, pre-op evaluation, timeout performed, IV checked, risks and benefits discussed and monitors and equipment checked Spinal Block Patient position: sitting Prep: Betadine Patient monitoring: heart rate, continuous pulse ox and blood pressure Approach: midline Location: L3-4 Injection technique: single-shot Needle Needle type: [place]  Needle gauge: 25 G Needle length: 9 cm Assessment Sensory level: T6 Additional Notes Expiration date of kit checked and confirmed. Patient tolerated procedure well, without complications.

## 2012-03-26 NOTE — Op Note (Signed)
NAME:  Raven Mendoza                      MEDICAL RECORD NO.:  [id number]                             FACILITY:  [facility]      PHYSICIAN:  Madlyn Frankel. Charlann Boxer, M.D.  DATE OF BIRTH:  06/25/1961      DATE OF PROCEDURE:  03/26/2012                                     OPERATIVE REPORT         PREOPERATIVE DIAGNOSIS:  Right knee osteoarthritis.      POSTOPERATIVE DIAGNOSIS:  Right knee osteoarthritis.      FINDINGS:  The patient was noted to have complete loss of cartilage and   bone-on-bone arthritis with associated osteophytes in all three compartments of   the knee with a significant synovitis and associated effusion.      PROCEDURE:  Right total knee replacement.      COMPONENTS USED:  DePuy rotating platform posterior stabilized knee   system, a size 4N (narrow) femur, 3 tibia, 10 mm PS insert, and 35 patellar   button.      SURGEON:  Madlyn Frankel. Charlann Boxer, M.D.      ASSISTANT:  Lanney Gins, PA-C.      ANESTHESIA:  Spinal.      SPECIMENS:  None.      COMPLICATION:  None.      DRAINS:  One Hemovac.  EBL: <200cc      TOURNIQUET TIME:   Total Tourniquet Time Documented: Thigh (Right) - 44 minutes .      The patient was stable to the recovery room.      INDICATION FOR PROCEDURE:  Raven Mendoza is a 51 y.o. female patient of   mine.  The patient had been seen, evaluated, and treated conservatively in the   office with medication, activity modification, and injections.  The patient had   radiographic changes of bone-on-bone arthritis with endplate sclerosis and osteophytes noted.      The patient failed conservative measures including medication, injections, and activity modification, and at this point was ready for more definitive measures.   Based on the radiographic changes and failed conservative measures, the patient   decided to proceed with total knee replacement.  Risks of infection,   DVT, component failure, need for revision surgery, postop course, and   expectations  were all   discussed and reviewed.  Consent was obtained for benefit of pain   relief.      PROCEDURE IN DETAIL:  The patient was brought to the operative theater.   Once adequate anesthesia, preoperative antibiotics, 600mg  of Clindamycin administered, the patient was positioned supine with the right thigh tourniquet placed.  The  right lower extremity was prepped and draped in sterile fashion.  A time-   out was performed identifying the patient, planned procedure, and   extremity.      The right lower extremity was placed in the [facility] leg holder.  The leg was   exsanguinated, tourniquet elevated to 250 mmHg.  A midline incision was   made followed by median parapatellar arthrotomy.  Following initial   exposure, attention was first directed to the patella.  Precut  measurement was noted to be 20 mm.  I resected down to 13 mm and used a   35 patellar button to restore patellar height as well as cover the cut   surface.      The lug holes were drilled and a metal shim was placed to protect the   patella from retractors and saw blades.      At this point, attention was now directed to the femur.  The femoral   canal was opened with a drill, irrigated to try to prevent fat emboli.  An   intramedullary rod was passed at 3 degrees valgus, 10 mm of bone was   resected off the distal femur.  Following this resection, the tibia was   subluxated anteriorly.  Using the extramedullary guide, 10 mm of bone was resected off   the proximal lateral tibia.  We confirmed the gap would be   stable medially and laterally with a 10 mm insert as well as confirmed   the cut was perpendicular in the coronal plane, checking with an alignment rod.      Once this was done, I sized the femur to be a size 4 in the anterior-   posterior dimension, chose a narrow component based on medial and   lateral dimension.  The size 4 rotation block was then pinned in   position anterior referenced using the C-clamp to  set rotation.  The   anterior, posterior, and  chamfer cuts were made without difficulty nor   notching making certain that I was along the anterior cortex to help   with flexion gap stability.      The final box cut was made off the lateral aspect of distal femur.      At this point, the tibia was sized to be a size 3, the size 3 tray was   then pinned in position through the medial third of the tubercle,   drilled, and keel punched.  Trial reduction was now carried with a 4N femur,  3 tibia, a 10 mm insert, and the 35 patella botton.  The knee was brought to   extension, full extension with good flexion stability with the patella   tracking through the trochlea without application of pressure.  Given   all these findings, the trial components removed.  Final components were   opened and cement was mixed.  The knee was irrigated with normal saline   solution and pulse lavage.  The synovial lining was   then injected with 0.25% Marcaine with epinephrine and 1 cc of Toradol,   total of 61 cc.      The knee was irrigated.  Final implants were then cemented onto clean and   dried cut surfaces of bone with the knee brought to extension with a 10 mm trial insert.      Once the cement had fully cured, the excess cement was removed   throughout the knee.  I confirmed I was satisfied with the range of   motion and stability, and the final 10 mm PS insert was chosen.  It was   placed into the knee.      The tourniquet had been let down at 43 minutes.  No significant   hemostasis required.  The medium Hemovac drain was placed deep.  The   extensor mechanism was then reapproximated using #1 Vicryl with the knee   in flexion.  The   remaining wound was closed with 2-0 Vicryl and  running 4-0 Monocryl.   The knee was cleaned, dried, dressed sterilely using Dermabond and   Aquacel dressing.  Drain site dressed separately.  The patient was then   brought to recovery room in stable condition,  tolerating the procedure   well.   Please note that Physician Assistant, Lanney Gins, was present for the entirety of the case, and was utilized for pre-operative positioning, peri-operative retractor management, general facilitation of the procedure.  He was also utilized for primary wound closure at the end of the case.              Madlyn Frankel Charlann Boxer, M.D.

## 2012-03-27 ENCOUNTER — Encounter (HOSPITAL_COMMUNITY): Payer: Self-pay | Admitting: Orthopedic Surgery

## 2012-03-27 LAB — BASIC METABOLIC PANEL
BUN: 11 mg/dL (ref 6–23)
CO2: 23 mEq/L (ref 19–32)
Glucose, Bld: 125 mg/dL — ABNORMAL HIGH (ref 70–99)
Potassium: 3.7 mEq/L (ref 3.5–5.1)
Sodium: 138 mEq/L (ref 135–145)

## 2012-03-27 LAB — CBC
HCT: 33.8 % — ABNORMAL LOW (ref 36.0–46.0)
Hemoglobin: 11.2 g/dL — ABNORMAL LOW (ref 12.0–15.0)
MCH: 27.6 pg (ref 26.0–34.0)
RBC: 4.06 MIL/uL (ref 3.87–5.11)

## 2012-03-27 MED ORDER — POLYETHYLENE GLYCOL 3350 17 G PO PACK: 17.0000 g | PACK | Freq: Two times a day (BID) | ORAL | Status: AC

## 2012-03-27 MED ORDER — DIPHENHYDRAMINE HCL 25 MG PO CAPS: 25.0000 mg | ORAL_CAPSULE | Freq: Four times a day (QID) | ORAL | Status: DC | PRN

## 2012-03-27 MED ORDER — FERROUS SULFATE 325 (65 FE) MG PO TABS: 325.0000 mg | ORAL_TABLET | Freq: Three times a day (TID) | ORAL | Status: DC

## 2012-03-27 MED ORDER — DSS 100 MG PO CAPS: 100.0000 mg | ORAL_CAPSULE | Freq: Two times a day (BID) | ORAL | Status: AC

## 2012-03-27 MED ORDER — METHOCARBAMOL 500 MG PO TABS: 500.0000 mg | ORAL_TABLET | Freq: Four times a day (QID) | ORAL | Status: AC | PRN

## 2012-03-27 NOTE — Progress Notes (Signed)
Occupational Therapy Note Chart reviewed. Spoke with pt and she has had previous knee surgery and feels comfortable with how to perform ADL. She has all DME and assist available at discharge. Will sign off. Judithann Sauger OTR/L [phone] 03/27/2012

## 2012-03-27 NOTE — Op Note (Signed)
Raven Mendoza, Raven Mendoza                ACCOUNT NO.:  [id number]  MEDICAL RECORD NO.:  [id number]  LOCATION:  1620                         FACILITY:  [facility]  PHYSICIAN:  Raven Frankel. Charlann Mendoza, M.D.  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  POSTOPERATIVE DIAGNOSIS: 1. Status post left total knee replacement with postoperative     stiffness and arthrofibrosis associated with pain. 2. This procedure was done following the completion of a right total     knee replacement on the same patient.  PROCEDURE:  Left knee examination followed by manipulation under anesthesia.  FINDINGS:  The patient noted preoperative range of motion of 5 to about 70 degrees with manipulation both in extension and flexion.  I was able to get 0-110 degrees, but did note that based on her posterior thigh and calf habitus that with pressure though I was to get her to over 100-110 degrees fairly easily, she did want to spring back to 90 degrees based on the soft tissues.  I do not think she should have long term issues related to scarring as she released fairly easily.  SURGEON:  Raven Frankel. Charlann Boxer, MD  ASSISTANT:  None.  ANESTHESIA:  Spinal.  SPECIMENS:  None.  COMPLICATION:  None.  INDICATIONS FOR PROCEDURE:  Raven Mendoza is a 51 year old patient with bilateral knee advanced degenerative joint changes and arthritis.  She had a history of a left total knee replacement performed in April of this year.  She at this point was ready for her right knee to be replaced.  At the time of her discussing getting her right knee replacement performed, we reviewed her left knee range of motion and felt that it would be beneficial for her to have her left knee manipulated.  I reviewed the risks and benefits of  this specific procedure with her at that time as well as the postoperative course and expectations.  PROCEDURE IN DETAIL:  The patient had been positioned supine on the operative table and we completed the right knee replacement under spinal block anesthesia and the dressing applied.  Procedure began with the left knee.  A second time-out was performed identifying the patient, planned procedure, and this extremity procedure.  Examination revealed a 5-degree lack in full extension and flexion about 70 degrees passively with the hip flexed.  I did place the heel and gently applied downward pressure and was able to get it fully straight passively without significant lysis of adhesions.  Then, the hip was flexed and pressure applied across the proximal tibia and distal femur, and I was able to easily lyse adhesions to get her flex back to 110 degrees.  Again with pressure, I was able to hold her at 110 degrees but as soon as I let go, she would spring up to about 90 degrees and this was related to the posterior thigh and calf soft tissues.  I felt that there was very little more to gain from further pressure and thus procedure ended.  She was then brought to recovery room in stable condition, tolerating the procedure well.  Postoperatively we placed her in the CPM with both extremities. Consider home CPM use for 1-3 months for her to maximize her range of motion.  She otherwise has done exceptionally well with her left knee with regards to pain and her recovery.  We just want try to maximize her overall result.     Raven Mendoza, M.D.     MDO/MEDQ  D:  03/26/2012  T:  03/26/2012  Job:  [id number]

## 2012-03-27 NOTE — Progress Notes (Signed)
Subjective: 1 Day Post-Op Procedure(s) (LRB): TOTAL KNEE ARTHROPLASTY (Right) CLOSED MANIPULATION KNEE (Left)   Patient reports pain as mild, pain well controlled with medication. No events throughout the night. Did well with CPM machine yesterday.  Objective:   VITALS:   Filed Vitals:   03/27/12 0637  BP: 109/73  Pulse: 77  Temp: 97.9 F (36.6 C)  Resp: 16    Neurovascular intact Dorsiflexion/Plantar flexion intact Incision: dressing C/D/I No cellulitis present Compartment soft  LABS  Basename 03/27/12 0440  HGB 11.2*  HCT 33.8*  WBC 12.2*  PLT 240     Basename 03/27/12 0440  NA 138  K 3.7  BUN 11  CREATININE 0.48*  GLUCOSE 125*     Assessment/Plan: 1 Day Post-Op Procedure(s) (LRB): TOTAL KNEE ARTHROPLASTY (Right) CLOSED MANIPULATION KNEE (Left)   HV drain d/c'ed Foley cath d/c'ed Advance diet Up with therapy D/C IV fluids Discharge home with home health   Anastasio Auerbach. Enrique Weiss   PAC  03/27/2012, 10:22 AM

## 2012-03-27 NOTE — Progress Notes (Signed)
Physical Therapy Treatment Patient Details Name: Raven Mendoza MRN: [id number] DOB: 1961/03/23 Today's Date: 03/27/2012 Time: 1345-1415 PT Time Calculation (min): 30 min  PT Assessment / Plan / Recommendation Comments on Treatment Session       Follow Up Recommendations  Home health PT    Barriers to Discharge        Equipment Recommendations  None recommended by PT    Recommendations for Other Services    Frequency 7X/week   Plan Discharge plan remains appropriate    Precautions / Restrictions Precautions Precautions: Knee Required Braces or Orthoses: Knee Immobilizer - Right Knee Immobilizer - Right: Discontinue once straight leg raise with < 10 degree lag Restrictions Weight Bearing Restrictions: No Other Position/Activity Restrictions: WBAT   Pertinent Vitals/Pain 4/10; premedicated    Mobility  Bed Mobility Bed Mobility: Supine to Sit;Sit to Supine Supine to Sit: 4: Min guard Sit to Supine: 4: Min assist Details for Bed Mobility Assistance: min assist with R LE into bed Transfers Transfers: Sit to Stand;Stand to Sit Sit to Stand: 4: Min guard Stand to Sit: 4: Min guard Details for Transfer Assistance: cues for use of UEs and for LE management Ambulation/Gait Ambulation/Gait Assistance: 4: Min guard Ambulation Distance (Feet): 185 Feet (185 and 25) Assistive device: Rolling walker Ambulation/Gait Assistance Details: cues for posture and position from RW Gait Pattern: Step-to pattern    Exercises     PT Diagnosis:    PT Problem List:   PT Treatment Interventions:     PT Goals Acute Rehab PT Goals PT Goal Formulation: With patient Time For Goal Achievement: 04/02/12 Potential to Achieve Goals: Good Pt will go Supine/Side to Sit: with supervision PT Goal: Supine/Side to Sit - Progress: Progressing toward goal Pt will go Sit to Supine/Side: with supervision PT Goal: Sit to Supine/Side - Progress: Progressing toward goal Pt will go Sit to Stand: with  supervision PT Goal: Sit to Stand - Progress: Progressing toward goal Pt will go Stand to Sit: with supervision PT Goal: Stand to Sit - Progress: Progressing toward goal Pt will Ambulate: 51 - 150 feet;with supervision;with rolling walker PT Goal: Ambulate - Progress: Progressing toward goal Pt will Go Up / Down Stairs: 1-2 stairs;with min assist;with least restrictive assistive device PT Goal: Up/Down Stairs - Progress: Goal set today  Visit Information  Last PT Received On: 03/27/12 Assistance Needed: +1    Subjective Data  Subjective: I'm doing so much better than last time Patient Stated Goal: Resume previous lifestyle with decreased pain   Cognition  Overall Cognitive Status: Appears within functional limits for tasks assessed/performed Arousal/Alertness: Awake/alert Orientation Level: Appears intact for tasks assessed Behavior During Session: [facility] for tasks performed    Balance     End of Session PT - End of Session Activity Tolerance: Patient tolerated treatment well Patient left: in bed;with call bell/phone within reach Nurse Communication: Mobility status   GP     Sharniece Gibbon 03/27/2012, 2:46 PM

## 2012-03-27 NOTE — Evaluation (Signed)
Physical Therapy Evaluation Patient Details Name: Raven Mendoza MRN: [id number] DOB: 1961/06/16 Today's Date: 03/27/2012 Time: 4098-1191 PT Time Calculation (min): 45 min  PT Assessment / Plan / Recommendation Clinical Impression  Pt with R TKR and manipulation of L TKR presents wtih Bila decreased ROM as well as decreased R LE strength causing limitations in functional mobility    PT Assessment  Patient needs continued PT services    Follow Up Recommendations  Home health PT    Barriers to Discharge        Equipment Recommendations  None recommended by PT    Recommendations for Other Services     Frequency 7X/week    Precautions / Restrictions Precautions Precautions: Knee Required Braces or Orthoses: Knee Immobilizer - Right Knee Immobilizer - Right: Discontinue once straight leg raise with < 10 degree lag (Pt performed IND SLR this am) Restrictions Weight Bearing Restrictions: No Other Position/Activity Restrictions: WBAT   Pertinent Vitals/Pain 5/10; pt premedicated, cold packs provided      Mobility  Bed Mobility Bed Mobility: Supine to Sit Supine to Sit: 4: Min assist Details for Bed Mobility Assistance: min cues for sequence  Transfers Transfers: Sit to Stand;Stand to Sit Sit to Stand: 4: Min assist Stand to Sit: 4: Min assist Details for Transfer Assistance: cues for use of UEs and for LE management Ambulation/Gait Ambulation/Gait Assistance: 4: Min assist Ambulation Distance (Feet): 98 Feet Assistive device: Rolling walker Ambulation/Gait Assistance Details: cues for sequence, stride length and position from RW Gait Pattern: Step-to pattern    Exercises Total Joint Exercises Ankle Circles/Pumps: AROM;10 reps;Supine;Both Quad Sets: AROM;Both;10 reps;Supine Heel Slides: AAROM;Both;10 reps;Supine Straight Leg Raises: AROM;AAROM;Both;10 reps;Supine   PT Diagnosis: Difficulty walking  PT Problem List: Decreased strength;Decreased range of  motion;Decreased activity tolerance;Decreased mobility;Decreased knowledge of use of DME;Pain PT Treatment Interventions: DME instruction;Gait training;Stair training;Functional mobility training;Therapeutic activities;Therapeutic exercise;Patient/family education   PT Goals Acute Rehab PT Goals PT Goal Formulation: With patient Time For Goal Achievement: 04/02/12 Potential to Achieve Goals: Good Pt will go Supine/Side to Sit: with supervision PT Goal: Supine/Side to Sit - Progress: Goal set today Pt will go Sit to Supine/Side: with supervision PT Goal: Sit to Supine/Side - Progress: Goal set today Pt will go Sit to Stand: with supervision PT Goal: Sit to Stand - Progress: Goal set today Pt will go Stand to Sit: with supervision PT Goal: Stand to Sit - Progress: Goal set today Pt will Ambulate: 51 - 150 feet;with supervision;with rolling walker PT Goal: Ambulate - Progress: Goal set today Pt will Go Up / Down Stairs: 1-2 stairs;with min assist;with least restrictive assistive device PT Goal: Up/Down Stairs - Progress: Goal set today  Visit Information  Last PT Received On: 03/27/12 Assistance Needed: +1    Subjective Data  Subjective: I think this knee is bending as well as the other one already Patient Stated Goal: Resume previous lifestyle with decreased pain   Prior Functioning  Home Living Lives With: Spouse;Family Available Help at Discharge: Family Type of Home: House Home Access: Stairs to enter Secretary/administrator of Steps: 2 Entrance Stairs-Rails: Right;Left;Can reach both Home Layout: One level Home Adaptive Equipment: Walker - rolling Prior Function Level of Independence: Independent Able to Take Stairs?: Yes Driving: Yes Communication Communication: No difficulties    Cognition  Overall Cognitive Status: Appears within functional limits for tasks assessed/performed Arousal/Alertness: Awake/alert Orientation Level: Appears intact for tasks  assessed Behavior During Session: [place] for tasks performed    Extremity/Trunk Assessment Right  Upper Extremity Assessment RUE ROM/Strength/Tone: Within functional levels Left Upper Extremity Assessment LUE ROM/Strength/Tone: Within functional levels Right Lower Extremity Assessment RLE ROM/Strength/Tone: Deficits RLE ROM/Strength/Tone Deficits: Quads 3/5; AARON at knee -10 - 75 Left Lower Extremity Assessment LLE ROM/Strength/Tone: Deficits LLE ROM/Strength/Tone Deficits: Quads WFL; AAROM at knee -10 - 80   Balance    End of Session PT - End of Session Activity Tolerance: Patient tolerated treatment well Patient left: in chair;with call bell/phone within reach Nurse Communication: Mobility status  GP     Json Koelzer 03/27/2012, 11:57 AM

## 2012-03-28 LAB — BASIC METABOLIC PANEL
BUN: 8 mg/dL (ref 6–23)
CO2: 25 mEq/L (ref 19–32)
Calcium: 8.7 mg/dL (ref 8.4–10.5)
Creatinine, Ser: 0.51 mg/dL (ref 0.50–1.10)
Glucose, Bld: 139 mg/dL — ABNORMAL HIGH (ref 70–99)

## 2012-03-28 LAB — CBC
HCT: 31.2 % — ABNORMAL LOW (ref 36.0–46.0)
Hemoglobin: 10.3 g/dL — ABNORMAL LOW (ref 12.0–15.0)
MCH: 28.1 pg (ref 26.0–34.0)
MCHC: 33 g/dL (ref 30.0–36.0)
MCV: 85 fL (ref 78.0–100.0)
RBC: 3.67 MIL/uL — ABNORMAL LOW (ref 3.87–5.11)

## 2012-03-28 MED ORDER — HYDROCODONE-ACETAMINOPHEN 7.5-325 MG PO TABS: 1.0000 | ORAL_TABLET | ORAL | Status: AC | PRN

## 2012-03-28 NOTE — Progress Notes (Signed)
Physical Therapy Treatment Patient Details Name: DANIELA SIEBERS MRN: [id number] DOB: 19-Aug-1961 Today's Date: 03/28/2012 Time: 6644-0347 PT Time Calculation (min): 41 min  PT Assessment / Plan / Recommendation Comments on Treatment Session  Pt reports increased "soreness" in R knee and demonstrates increased difficulty with SLR.  Pt instructed on and placed in KI    Follow Up Recommendations  Home health PT    Barriers to Discharge        Equipment Recommendations  None recommended by PT    Recommendations for Other Services    Frequency 7X/week   Plan Discharge plan remains appropriate    Precautions / Restrictions Precautions Precautions: Knee Required Braces or Orthoses: Knee Immobilizer - Right Knee Immobilizer - Right: Discontinue once straight leg raise with < 10 degree lag Restrictions Weight Bearing Restrictions: No Other Position/Activity Restrictions: WBAT   Pertinent Vitals/Pain 3-4/10; pt premedicated, cold packs provided    Mobility  Bed Mobility Bed Mobility: Supine to Sit Supine to Sit: 5: Supervision Transfers Transfers: Sit to Stand;Stand to Sit Sit to Stand: 5: Supervision Stand to Sit: 5: Supervision Details for Transfer Assistance: min cues for use of UEs Ambulation/Gait Ambulation/Gait Assistance: 4: Min guard Ambulation Distance (Feet): 150 Feet Assistive device: Rolling walker Ambulation/Gait Assistance Details: min cues for position from RW Gait Pattern: Step-to pattern Stairs: Yes Stairs Assistance: 4: Min guard Stair Management Technique: Two rails;Step to pattern;Forwards Number of Stairs: 2     Exercises Total Joint Exercises Ankle Circles/Pumps: AROM;20 reps;Both;Supine Quad Sets: AROM;Both;20 reps;Supine Heel Slides: AAROM;20 reps;Both;Supine Straight Leg Raises: AROM;AAROM;20 reps;Both;Supine   PT Diagnosis:    PT Problem List:   PT Treatment Interventions:     PT Goals Acute Rehab PT Goals PT Goal Formulation: With  patient Time For Goal Achievement: 04/02/12 Potential to Achieve Goals: Good Pt will go Supine/Side to Sit: with supervision PT Goal: Supine/Side to Sit - Progress: Met Pt will go Sit to Supine/Side: with supervision PT Goal: Sit to Supine/Side - Progress: Progressing toward goal Pt will go Sit to Stand: with supervision PT Goal: Sit to Stand - Progress: Met Pt will go Stand to Sit: with supervision PT Goal: Stand to Sit - Progress: Met Pt will Ambulate: 51 - 150 feet;with supervision;with rolling walker PT Goal: Ambulate - Progress: Met Pt will Go Up / Down Stairs: 1-2 stairs;with min assist;with least restrictive assistive device PT Goal: Up/Down Stairs - Progress: Met  Visit Information  Last PT Received On: 03/28/12 Assistance Needed: +1    Subjective Data  Subjective: I'm ready to go home Patient Stated Goal: Resume previous lifestyle with decreased pain   Cognition  Overall Cognitive Status: Appears within functional limits for tasks assessed/performed Arousal/Alertness: Awake/alert Orientation Level: Appears intact for tasks assessed Behavior During Session: [facility] for tasks performed    Balance     End of Session PT - End of Session Equipment Utilized During Treatment: Right knee immobilizer Activity Tolerance: Patient tolerated treatment well Patient left: in chair;with call bell/phone within reach Nurse Communication: Mobility status CPM Left Knee CPM Left Knee: Off CPM Right Knee CPM Right Knee: Off   GP     Phoenicia Pirie 03/28/2012, 11:54 AM

## 2012-03-28 NOTE — Progress Notes (Signed)
Comments:  03/28/2012 [name] BSN CCM [phone] Orders received during progrssion that patient will need CPM machine. tct [employer] rep requesting CPM ([phone] [phone]/hh orders, face sheet, H&P faxed with confirmation  [employer] notified of patient discharge and of CPM. Cpm will be delivered to patient's home via [employer]. Pt has alreaDY BEEN BY [employer] OF TIME OF DELIVERY THIS PM. HH SERVICES TO START TOMORROW 03/29/2012

## 2012-03-28 NOTE — Progress Notes (Signed)
  Subjective: 2 Days Post-Op Procedure(s) (LRB): TOTAL KNEE ARTHROPLASTY (Right) CLOSED MANIPULATION KNEE (Left)   Patient reports pain as mild, pain well controlled. No events throughout the night. Ready to be discharged home.  Objective:   VITALS:   Filed Vitals:   03/28/12 0608  BP: 138/74  Pulse: 64  Temp: 98.2 F (36.8 C)  Resp: 16    Neurovascular intact Dorsiflexion/Plantar flexion intact Incision: dressing C/D/I No cellulitis present Compartment soft  LABS  Basename 03/28/12 0435 03/27/12 0440  HGB 10.3* 11.2*  HCT 31.2* 33.8*  WBC 9.5 12.2*  PLT 223 240     Basename 03/28/12 0435 03/27/12 0440  NA 137 138  K 3.7 3.7  BUN 8 11  CREATININE 0.51 0.48*  GLUCOSE 139* 125*     Assessment/Plan: 2 Days Post-Op Procedure(s) (LRB): TOTAL KNEE ARTHROPLASTY (Right) CLOSED MANIPULATION KNEE (Left)   Up with therapy Discharge home with home health Follow up in 2 weeks at [facility].  Follow-up Information    Follow up with OLIN,Jebediah Macrae D in 2 weeks.   Contact information:   [facility] [address] [place] [address] [phone]          Anastasio Auerbach. Kenshin Splawn   PAC  03/28/2012, 8:40 AM

## 2012-03-28 NOTE — Discharge Summary (Signed)
Physician Discharge Summary  Patient ID: Raven Mendoza MRN: [id number] DOB/AGE: 1961/06/27 51 y.o.  Admit date: 03/26/2012 Discharge date: 03/28/2012  Procedures:  Procedure(s) (LRB): TOTAL KNEE ARTHROPLASTY (Right) CLOSED MANIPULATION KNEE (Left)  Attending Physician:  Dr. Durene Romans   Admission Diagnoses:   1. Osteoarthritis right knee 2. Status post total knee arthroplasty left knee with stiffness  Discharge Diagnoses:  Principal Problem:  *S/P right TKA Active Problems:  Post-op left knee stiffness Hypothyroidism Osteoarthritis   HPI: This is a 51 year old lady with a history of end-stage osteoarthritis of her left knee that has failed, that had total knee arthroplasty but has gotten stiff with decreased flexion. She also has osteoarthritis of her right knee, for which she is going to be undergoing a total knee arthroplasty of the right knee. She will also undergo manipulation under anesthesia of her left knee to increase her flexion. The surgery risks, benefits, and aftercare were detailed with the patient. Questions were invited and answered. Note that her medical caregiver is Geanie Logan, [place], and specialty physician, Dr.  Leslie Dales, for endocrinology. Note that she is a candidate for tranexamic acid and dexamethasone, will receive both at surgery. She is given her home medications of aspirin, iron, Robaxin, MiraLAX, and Colace, and again plans on going home after surgery.  PCP: Frazier Richards, MD   Discharged Condition: good  Hospital Course:  Patient underwent the above stated procedure on 03/26/2012. Patient tolerated the procedure well and brought to the recovery room in good condition and subsequently to the floor.  POD #1 BP: 109/73 ; Pulse: 77 ; Temp: 97.9 F (36.6 C) ; Resp: 16  Pt's foley was removed, as well as the hemovac drain removed. IV was changed to a saline lock. Patient reports pain as mild, pain well controlled with medication. No events throughout  the night. Did well with CPM machine yesterday. Neurovascular intact, dorsiflexion/plantar flexion intact, incision: dressing C/D/I, no cellulitis present and compartment soft.   LABS  Basename  03/27/12 0440   HGB  11.2  HCT  33.8   POD #2  BP: 138/74 ; Pulse: 64 ; Temp: 98.2 F (36.8 C) ; Resp: 16  Patient reports pain as mild, pain well controlled. No events throughout the night. Ready to be discharged home. Neurovascular intact, dorsiflexion/plantar flexion intact, incision: dressing C/D/I, no cellulitis present and compartment soft.   LABS  Basename  03/28/12 0435   HGB  10.3  HCT  31.2    Discharge Exam: General appearance: alert, cooperative and no distress Extremities: Homans sign is negative, no sign of DVT, no edema, redness or tenderness in the calves or thighs and no ulcers, gangrene or trophic changes  Disposition: Home  with follow up in 2 weeks   Follow-up Information    Follow up with Shelda Pal, MD in 2 weeks.   Contact information:   [facility] [address], [address] [place] [address] [phone]          Discharge Orders    Future Orders Please Complete By Expires   Call MD / Call 911      Comments:   If you experience chest pain or shortness of breath, CALL 911 and be transported to the hospital emergency room.  If you develope a fever above 101 F, pus (white drainage) or increased drainage or redness at the wound, or calf pain, call your surgeon's office.   Discharge instructions      Comments:   Maintain surgical  dressing for 8 days, then replace with gauze and tape. Keep the area dry and clean until follow up. Follow up in 2 weeks at [facility]. Call with any questions or concerns.  Home use of CPM machine. 0-90, increasing as tolerated to max. Increase 10 degrees daily as tolerated to max. 2-4 hours per day per leg.   Constipation Prevention      Comments:   Drink plenty of fluids.  Prune  juice may be helpful.  You may use a stool softener, such as Colace (over the counter) 100 mg twice a day.  Use MiraLax (over the counter) for constipation as needed.   Increase activity slowly as tolerated      Driving restrictions      Comments:   No driving for 4 weeks   TED hose      Comments:   Use stockings (TED hose) for 2 weeks on both leg(s).  You may remove them at night for sleeping.   Change dressing      Comments:   Maintain surgical dressing for 8 days, then change the dressing daily with sterile 4 x 4 inch gauze dressing and tape. Keep the area dry and clean.      Current Discharge Medication List    START taking these medications   Details  diphenhydrAMINE (BENADRYL) 25 mg capsule Take 1 capsule (25 mg total) by mouth every 6 (six) hours as needed for itching, allergies or sleep. Qty: 30 capsule    docusate sodium 100 MG CAPS Take 100 mg by mouth 2 (two) times daily. Qty: 10 capsule    ferrous sulfate 325 (65 FE) MG tablet Take 1 tablet (325 mg total) by mouth 3 (three) times daily after meals.    HYDROcodone-acetaminophen (NORCO) 7.5-325 MG per tablet Take 1-2 tablets by mouth every 4 (four) hours as needed for pain. Qty: 120 tablet, Refills: 0    methocarbamol (ROBAXIN) 500 MG tablet Take 1 tablet (500 mg total) by mouth every 6 (six) hours as needed (muscle spasms).    polyethylene glycol (MIRALAX / GLYCOLAX) packet Take 17 g by mouth 2 (two) times daily. Qty: 14 each      CONTINUE these medications which have NOT CHANGED   Details  levothyroxine (SYNTHROID, LEVOTHROID) 125 MCG tablet Take 125 mcg by mouth daily before breakfast.    cycloSPORINE (RESTASIS) 0.05 % ophthalmic emulsion Place 1 drop into both eyes 2 (two) times daily.    Polyethyl Glycol-Propyl Glycol (SYSTANE OP) Apply 1 drop to eye 2 (two) times daily as needed. Dry eyes       STOP taking these medications     HYDROcodone-acetaminophen (NORCO) 5-325 MG per tablet Comments:  Reason for  Stopping:       meloxicam (MOBIC) 15 MG tablet Comments:  Reason for Stopping:           Signed: [name]   PAC  03/28/2012, 8:52 AM

## 2012-03-28 NOTE — Care Management Note (Unsigned)
    Page 1 of 2   03/28/2012     9:08:50 AM   CARE MANAGEMENT NOTE 03/28/2012  Patient:  Raven, UREY Mendoza   Account Number:  [id number]  Date Initiated:  03/27/2012  Documentation initiated by:  Colleen Can  Subjective/Objective Assessment:   dx osteoarthritis rt knee and arthrofibrosis left knee; total rt knee replacemnt and manipulation of left knee under anesthesia     Action/Plan:   CM spoke with patient. Plans are for patient to retrun to her home in [place] where daughter and mother-in-law will be caregivers. She already has DME and is requesting [facility] for [facility] services. Has used in the past   Anticipated DC Date:  03/29/2012   Anticipated DC Plan:  HOME W HOME HEALTH SERVICES  In-house referral  NA      DC Planning Services  CM consult      [facility] Choice  HOME HEALTH   Choice offered to / List presented to:  C-1 Patient   DME arranged  NA      DME agency  NA     HH arranged  HH-2 PT      HH agency  [facility]   Status of service:  Completed, signed off Medicare Important Message given?  NO (If response is "NO", the following Medicare IM given date fields will be blank) Date Medicare IM given:   Date Additional Medicare IM given:    Discharge Disposition:    Per UR Regulation:    If discussed at Long Length of Stay Meetings, dates discussed:    Comments:  03/27/2012 Sanda Linger CCM [phone] [facility]ct [facility] intake-Sally/they can service patient with HHpt and she will get same therapist at her request. HHorders, op note, H&P faxed to [facility] with confirmation.

## 2013-02-03 ENCOUNTER — Emergency Department (HOSPITAL_COMMUNITY)
Admission: EM | Admit: 2013-02-03 | Discharge: 2013-02-03 | Disposition: A | Discharge status: Home / Self Care | Payer: BC Managed Care – PPO | Attending: Emergency Medicine | Admitting: Emergency Medicine

## 2013-02-03 ENCOUNTER — Encounter (HOSPITAL_COMMUNITY): Payer: Self-pay | Admitting: Emergency Medicine

## 2013-02-03 DIAGNOSIS — E039 Hypothyroidism, unspecified: Secondary | ICD-10-CM | POA: Insufficient documentation

## 2013-02-03 DIAGNOSIS — Z79899 Other long term (current) drug therapy: Secondary | ICD-10-CM | POA: Insufficient documentation

## 2013-02-03 DIAGNOSIS — F0781 Postconcussional syndrome: Secondary | ICD-10-CM | POA: Insufficient documentation

## 2013-02-03 DIAGNOSIS — M129 Arthropathy, unspecified: Secondary | ICD-10-CM | POA: Insufficient documentation

## 2013-02-03 MED ORDER — MECLIZINE HCL 25 MG PO TABS
25.0000 mg | ORAL_TABLET | Freq: Four times a day (QID) | ORAL | Status: DC | PRN
Start: 2013-02-03 — End: 2013-11-24

## 2013-02-03 NOTE — ED Notes (Signed)
Pt was knocked over yesterday by some cows and states that ever since she has had dizzyness and some confusion. States that she just has not been feeling right.

## 2013-02-03 NOTE — ED Provider Notes (Signed)
History     CSN: [id number]  Arrival date & time 02/03/13  1432   First MD Initiated Contact with Patient 02/03/13 1505      Chief Complaint  Patient presents with  . Dizziness    (Consider location/radiation/quality/duration/timing/severity/associated sxs/prior treatment) HPI This 52 year old female was knocked over last night by some of the cows on her property at about 9 PM, this was a witnessed event, the patient may possibly have had brief loss of consciousness for seconds however it was not prolonged if she had loss of consciousness at all, she however immediately had amnesia for the event and may have been briefly confused, the patient is no headache no confusion since the event no change in speech vision swallowing or understanding no focal or lateralizing weakness or numbness and no incoordination using her arms however she moves quickly she does feel unsteady with a sensation of vertigo as and difficulty walking but is able to walk unassisted his lungs she is moving slowly. She is not on blood thinners. There is no treatment prior to arrival. She is no headache. She has had intermittent nausea with movement only with no nausea she stays still. She had one episode of vomiting this morning while she was getting dressed and had transient vertigo. She has no vertigo at all if she stays still. Past Medical History  Diagnosis Date  . Hypothyroidism   . Arthritis     Past Surgical History  Procedure Laterality Date  . Radioactive iodine tx for thyroid  4 yrs ago,and  5 yrs ago  . C sections  1987 and 1990  . Total hip arthroplasty  10/24/2011    right  . Total knee arthroplasty  01/16/2012    left  . Total knee arthroplasty  03/26/2012    Procedure: TOTAL KNEE ARTHROPLASTY;  Surgeon: Shelda Pal, MD;  Location: [facility] ORS;  Service: Orthopedics;  Laterality: Right;  . Knee closed reduction  03/26/2012    Procedure: CLOSED MANIPULATION KNEE;  Surgeon: Shelda Pal, MD;  Location: [facility]  ORS;  Service: Orthopedics;  Laterality: Left;    No family history on file.  History  Substance Use Topics  . Smoking status: Never Smoker   . Smokeless tobacco: Never Used  . Alcohol Use: No    OB History   Grav Para Term Preterm Abortions TAB SAB Ect Mult Living                  Review of Systems 10 Systems reviewed and are negative for acute change except as noted in the HPI. Allergies  Penicillins  Home Medications   Current Outpatient Rx  Name  Route  Sig  Dispense  Refill  . AMINO ACIDS COMPLEX PO   Oral   Take 1 tablet by mouth daily.         . cholecalciferol (VITAMIN D) 1000 UNITS tablet   Oral   Take 1,000 Units by mouth daily.         . cycloSPORINE (RESTASIS) 0.05 % ophthalmic emulsion   Both Eyes   Place 1 drop into both eyes 2 (two) times daily.         Marland Kitchen levothyroxine (SYNTHROID, LEVOTHROID) 125 MCG tablet   Oral   Take 125 mcg by mouth daily before breakfast.         . meloxicam (MOBIC) 15 MG tablet   Oral   Take 7.5 mg by mouth 2 (two) times daily as needed for pain (pain).         Marland Kitchen  Polyethyl Glycol-Propyl Glycol (SYSTANE OP)   Ophthalmic   Apply 1 drop to eye 2 (two) times daily as needed. Dry eyes          . EXPIRED: diphenhydrAMINE (BENADRYL) 25 mg capsule   Oral   Take 1 capsule (25 mg total) by mouth every 6 (six) hours as needed for itching, allergies or sleep.   30 capsule      . meclizine (ANTIVERT) 25 MG tablet   Oral   Take 1 tablet (25 mg total) by mouth every 6 (six) hours as needed for dizziness.   20 tablet   0     BP 148/85  Pulse 78  Temp(Src) 98.3 F (36.8 C) (Oral)  Resp 14  SpO2 97%  LMP 10/23/2008  Physical Exam  Nursing note and vitals reviewed. Constitutional:  Awake, alert, nontoxic appearance with baseline speech for patient.  HENT:  Head: Atraumatic.  Mouth/Throat: No oropharyngeal exudate.  Eyes: EOM are normal. Pupils are equal, round, and reactive to light. Right eye exhibits no  discharge. Left eye exhibits no discharge.  No nystagmus noted and negative test of skew  Neck: Neck supple.  Cardiovascular: Normal rate and regular rhythm.   No murmur heard. Pulmonary/Chest: Effort normal and breath sounds normal. No stridor. No respiratory distress. She has no wheezes. She has no rales. She exhibits no tenderness.  Abdominal: Soft. Bowel sounds are normal. She exhibits no mass. There is no tenderness. There is no rebound.  Musculoskeletal: She exhibits no tenderness.  Baseline ROM, moves extremities with no obvious new focal weakness.  Lymphadenopathy:    She has no cervical adenopathy.  Neurological: She is alert.  Awake, alert, cooperative and aware of situation; motor strength bilaterally; sensation normal to light touch bilaterally; peripheral visual fields full to confrontation; no facial asymmetry; tongue midline; major cranial nerves appear intact; no pronator drift, normal finger to nose bilaterally, baseline gait without new ataxia.  Skin: No rash noted.  Psychiatric: She has a normal mood and affect.    ED Course  Procedures (including critical care time) Option of CT discussed, shared decision-making do not feel CT mandatory and Pt prefers/agrees no CT this visit. Patient informed of clinical course, understand medical decision-making process, and agree with plan. Labs Reviewed - No data to display No results found.   1. Concussion syndrome       MDM  I doubt any other EMC precluding discharge at this time including, but not necessarily limited to the following:ICH requiring intervention emergently.        Hurman Horn, MD 02/10/13 [phone]

## 2013-02-03 NOTE — ED Notes (Signed)
Pt states that around 9pm last night that some of her cows got out of the fence. In process of herding them back in to the fence, pt states that she got plowed over by one of the cows, pt states got the breath knocked out of her but denies any LOC.  Pt states that she slept fine last night but when she woke up this morning she has been dizzy and not feeling well.  Pt states that she had drank some tea this morning and when exerting herself to put on her pants she vomited.  Pt denies headache, but states that her jaw is sore.

## 2013-02-27 ENCOUNTER — Other Ambulatory Visit: Payer: Self-pay | Admitting: Physician Assistant

## 2013-02-28 NOTE — Telephone Encounter (Signed)
Medication refilled per protocol. 

## 2013-04-28 IMAGING — CR DG PORTABLE PELVIS
1 series · 1 of 1 positions shown · non-contrast
Comparison: None.

CLINICAL DATA: Right hip arthroplasty

PORTABLE PELVIS

[AP]
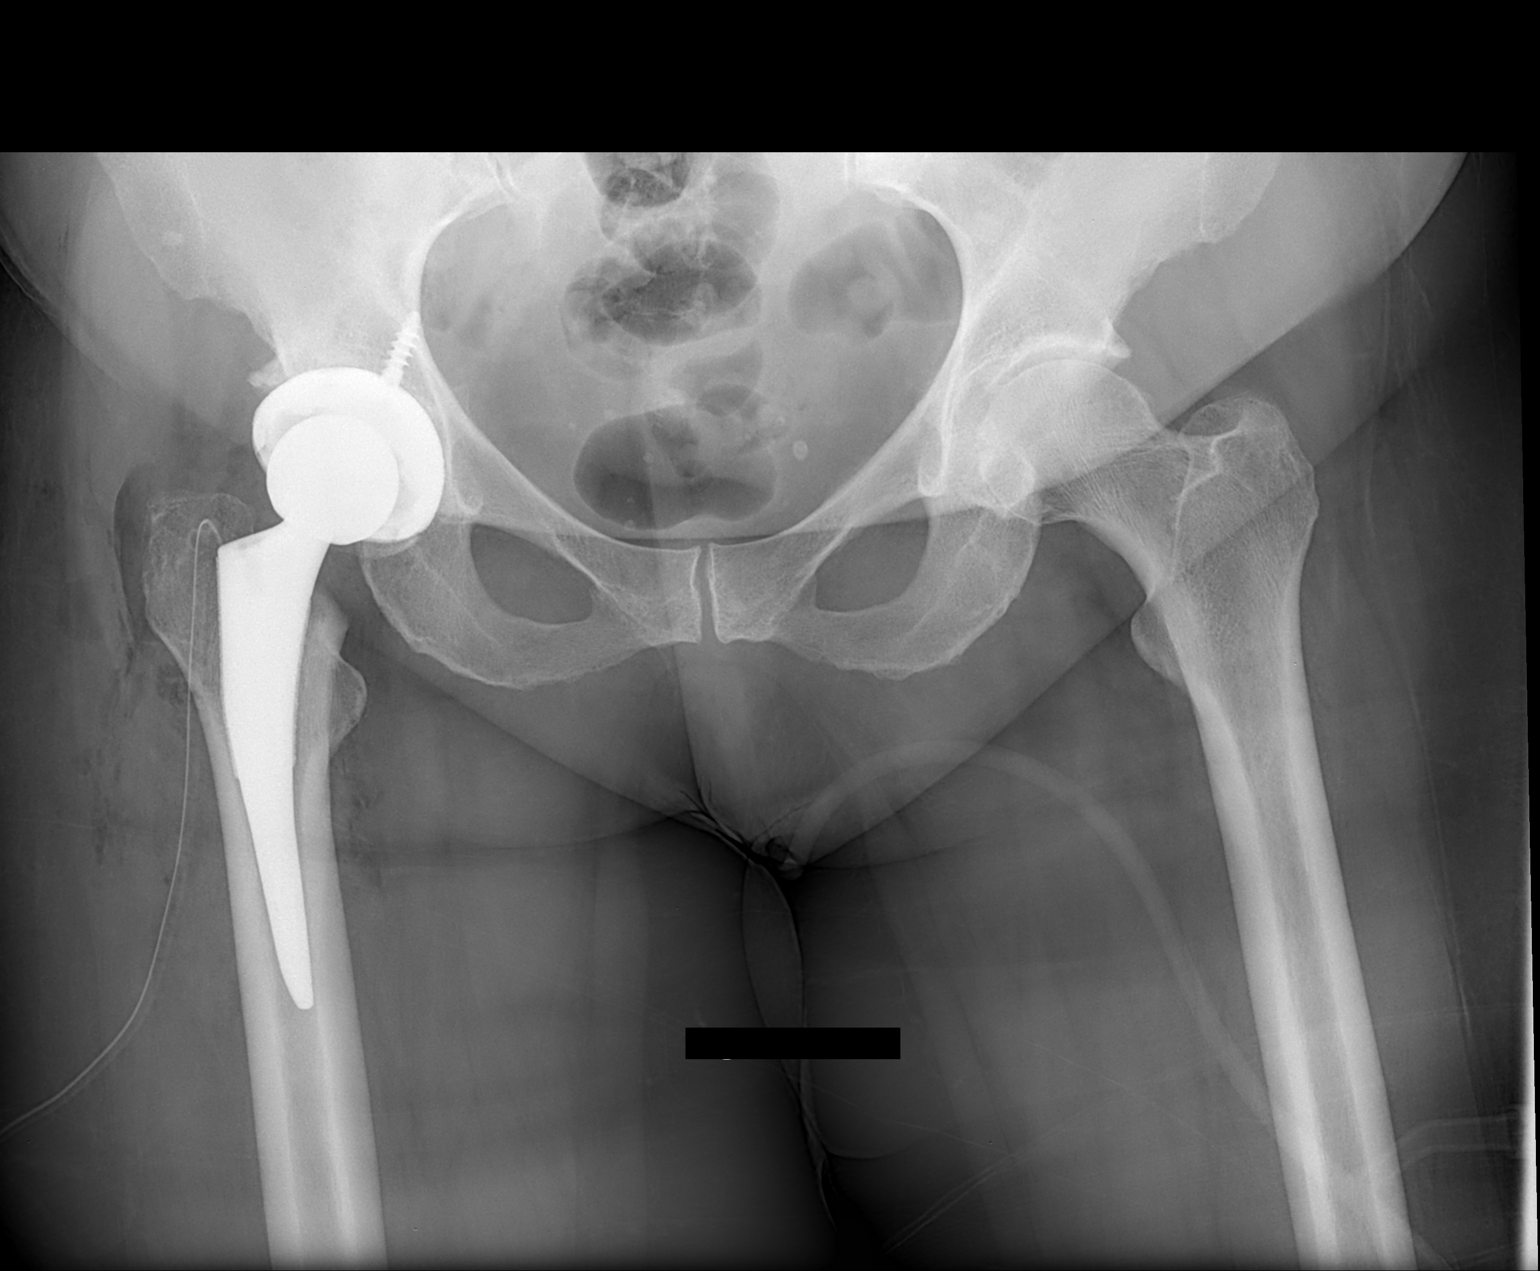

[1 of 1 positions shown; findings below may reference images not displayed]

FINDINGS: Components of right hip arthroplasty project in expected
location.  No fracture or dislocation.  Surgical drain projects
laterally.
IMPRESSION: 1.  Right hip arthroplasty without apparent complication.

## 2013-09-30 ENCOUNTER — Other Ambulatory Visit: Payer: Self-pay | Admitting: Physician Assistant

## 2013-10-01 ENCOUNTER — Encounter: Payer: Self-pay | Admitting: Family Medicine

## 2013-10-01 NOTE — Telephone Encounter (Signed)
Medication refill for one time only.  Patient needs to be seen.  Letter sent for patient to call and schedule 

## 2013-11-06 ENCOUNTER — Encounter: Payer: Self-pay | Admitting: Family Medicine

## 2013-11-06 ENCOUNTER — Other Ambulatory Visit: Payer: Self-pay | Admitting: Physician Assistant

## 2013-11-06 NOTE — Telephone Encounter (Signed)
Pt last OV 08/2011.  Refill denied.  Pt must be seen for any refills.  Letter sent.

## 2013-11-24 ENCOUNTER — Ambulatory Visit (INDEPENDENT_AMBULATORY_CARE_PROVIDER_SITE_OTHER): Payer: BC Managed Care – PPO | Admitting: Physician Assistant

## 2013-11-24 ENCOUNTER — Encounter: Payer: Self-pay | Admitting: Physician Assistant

## 2013-11-24 VITALS — BP 128/76 | HR 76 | Temp 98.0°F | Resp 18 | Ht 61.5 in | Wt 210.0 lb

## 2013-11-24 DIAGNOSIS — E032 Hypothyroidism due to medicaments and other exogenous substances: Secondary | ICD-10-CM

## 2013-11-24 DIAGNOSIS — Z0000 Encounter for general adult medical examination without abnormal findings: Secondary | ICD-10-CM

## 2013-11-24 DIAGNOSIS — E038 Other specified hypothyroidism: Secondary | ICD-10-CM

## 2013-11-24 DIAGNOSIS — E890 Postprocedural hypothyroidism: Secondary | ICD-10-CM | POA: Insufficient documentation

## 2013-11-24 DIAGNOSIS — E559 Vitamin D deficiency, unspecified: Secondary | ICD-10-CM | POA: Insufficient documentation

## 2013-11-24 DIAGNOSIS — T50904A Poisoning by unspecified drugs, medicaments and biological substances, undetermined, initial encounter: Secondary | ICD-10-CM

## 2013-11-24 DIAGNOSIS — M25569 Pain in unspecified knee: Secondary | ICD-10-CM | POA: Insufficient documentation

## 2013-11-24 MED ORDER — MELOXICAM 15 MG PO TABS: ORAL_TABLET | ORAL | Status: DC

## 2013-11-24 NOTE — Progress Notes (Signed)
Patient ID: Raven Mendoza MRN: [id number], DOB: 28-May-1961, 53 y.o. Date of Encounter: 11/24/2013, 3:38 PM    Chief Complaint:  Chief Complaint  Patient presents with  . overdue for routine check up     HPI: 53 y.o. year old white female has not had an office visit here in over one year.    She sees Dr. Elyse Hsu regarding her thyroid.  She has had hip surgery as well as knee surgeries by Dr. Alvan Dame. She says "she never would've thought she would be feeling as good." Is that after the surgery she was able to come off of the Mobic until just recently she's been taking half of a pill occasionally. Says that when she takes this it completely resolves her symptoms. She is here today just because she needs a refill on this. He is interested in scheduling a complete physical. 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 Says that the home health nurses come to check her mother in the mornings so afternoon appointment would be better and she can come for fasting labs prior.     Home Meds: See attached medication section for any medications that were entered at today's visit. The computer does not put those onto this list.The following list is a list of meds entered prior to today's visit.   Current Outpatient Prescriptions on File Prior to Visit  Medication Sig Dispense Refill  . AMINO ACIDS COMPLEX PO Take 1 tablet by mouth daily.      . cycloSPORINE (RESTASIS) 0.05 % ophthalmic emulsion Place 1 drop into both eyes 2 (two) times daily.      . diphenhydrAMINE (BENADRYL) 25 mg capsule Take 1 capsule (25 mg total) by mouth every 6 (six) hours as needed for itching, allergies or sleep.  30 capsule    . levothyroxine (SYNTHROID, LEVOTHROID) 125 MCG tablet Take 125 mcg by mouth daily before breakfast.      . Polyethyl Glycol-Propyl Glycol  (SYSTANE OP) Apply 1 drop to eye 2 (two) times daily as needed. Dry eyes       . cholecalciferol (VITAMIN D) 1000 UNITS tablet Take 1,000 Units by mouth daily.       No current facility-administered medications on file prior to visit.    Allergies:  Allergies  Allergen Reactions  . Penicillins Swelling      Review of Systems: See HPI for pertinent ROS. All other ROS negative.    Physical Exam: Blood pressure 128/76, pulse 76, temperature 98 F (36.7 C), temperature source Oral, resp. rate 18, height 5' 1.5" (1.562 m), weight 210 lb (95.255 kg), last menstrual period 10/23/2008., Body mass index is 39.04 kg/(m^2). General: An obese white female.  Appears in no acute distress. Neck: Supple. No thyromegaly. No lymphadenopathy. Lungs: Clear bilaterally to auscultation without wheezes, rales, or rhonchi. Breathing is unlabored. Heart: Regular rhythm. No murmurs, rubs, or gallops. Msk:  Strength and tone normal for age. Extremities/Skin: Warm and dry. Neuro: Alert and oriented X 3. Moves all extremities spontaneously. Gait is normal. CNII-XII grossly in tact. Psych:  Responds to questions appropriately with a normal affect.     ASSESSMENT AND PLAN:  53 y.o. year old female with  1. Knee pain - meloxicam (MOBIC) 15 MG tablet; take 1/2 tablet by mouth twice a day  Dispense: 30 tablet; Refill: 3  2. Vitamin D deficiency  3. Hypothyroidism due to medication Managed  by Dr. Elyse Hsu  4. Visit for preventive health examination - CBC with Differential; Future - COMPLETE METABOLIC PANEL WITH GFR; Future - Lipid panel; Future - Vit D  25 hydroxy (rtn osteoporosis monitoring); Future  She will schedule  complete physical exam. Will come for fasting labs prior to that.   [address] [place], [place], [facility] 11/24/2013 3:38 PM

## 2013-12-01 ENCOUNTER — Other Ambulatory Visit: Payer: BC Managed Care – PPO

## 2013-12-01 DIAGNOSIS — Z0000 Encounter for general adult medical examination without abnormal findings: Secondary | ICD-10-CM

## 2013-12-01 LAB — COMPLETE METABOLIC PANEL WITH GFR
ALT: 14 U/L (ref 0–35)
AST: 17 U/L (ref 0–37)
Albumin: 4.3 g/dL (ref 3.5–5.2)
Alkaline Phosphatase: 69 U/L (ref 39–117)
BUN: 19 mg/dL (ref 6–23)
CHLORIDE: 101 meq/L (ref 96–112)
CO2: 27 mEq/L (ref 19–32)
CREATININE: 0.64 mg/dL (ref 0.50–1.10)
Calcium: 9.5 mg/dL (ref 8.4–10.5)
GFR, Est Non African American: >89 mL/min
Glucose, Bld: 81 mg/dL (ref 70–99)
Potassium: 4.6 mEq/L (ref 3.5–5.3)
Sodium: 140 mEq/L (ref 135–145)
Total Bilirubin: 0.6 mg/dL (ref 0.2–1.2)
Total Protein: 7.2 g/dL (ref 6.0–8.3)

## 2013-12-01 LAB — LIPID PANEL
Cholesterol: 197 mg/dL (ref 0–200)
HDL: 80 mg/dL (ref >39)
LDL CALC: 106 mg/dL — AB (ref 0–99)
TRIGLYCERIDES: 53 mg/dL (ref <150)
Total CHOL/HDL Ratio: 2.5 Ratio
VLDL: 11 mg/dL (ref 0–40)

## 2013-12-01 LAB — CBC WITH DIFFERENTIAL/PLATELET
BASOS ABS: 0.1 10*3/uL (ref 0.0–0.1)
Basophils Relative: 1 % (ref 0–1)
Eosinophils Absolute: 0.2 10*3/uL (ref 0.0–0.7)
Eosinophils Relative: 3 % (ref 0–5)
HEMATOCRIT: 41.4 % (ref 36.0–46.0)
HEMOGLOBIN: 14.2 g/dL (ref 12.0–15.0)
LYMPHS PCT: 27 % (ref 12–46)
Lymphs Abs: 1.7 10*3/uL (ref 0.7–4.0)
MCH: 29 pg (ref 26.0–34.0)
MCHC: 34.3 g/dL (ref 30.0–36.0)
MCV: 84.5 fL (ref 78.0–100.0)
MONO ABS: 0.6 10*3/uL (ref 0.1–1.0)
MONOS PCT: 9 % (ref 3–12)
NEUTROS ABS: 3.8 10*3/uL (ref 1.7–7.7)
NEUTROS PCT: 60 % (ref 43–77)
Platelets: 308 10*3/uL (ref 150–400)
RBC: 4.9 MIL/uL (ref 3.87–5.11)
RDW: 14.5 % (ref 11.5–15.5)
WBC: 6.3 10*3/uL (ref 4.0–10.5)

## 2013-12-02 LAB — VITAMIN D 25 HYDROXY (VIT D DEFICIENCY, FRACTURES): Vit D, 25-Hydroxy: 24 ng/mL — ABNORMAL LOW (ref 30–89)

## 2013-12-03 ENCOUNTER — Encounter: Payer: Self-pay | Admitting: Physician Assistant

## 2013-12-03 ENCOUNTER — Ambulatory Visit (INDEPENDENT_AMBULATORY_CARE_PROVIDER_SITE_OTHER): Payer: BC Managed Care – PPO | Admitting: Physician Assistant

## 2013-12-03 VITALS — BP 120/78 | HR 76 | Temp 97.3°F | Resp 18 | Ht 62.0 in | Wt 210.0 lb

## 2013-12-03 DIAGNOSIS — Z23 Encounter for immunization: Secondary | ICD-10-CM

## 2013-12-03 DIAGNOSIS — E0590 Thyrotoxicosis, unspecified without thyrotoxic crisis or storm: Secondary | ICD-10-CM | POA: Insufficient documentation

## 2013-12-03 DIAGNOSIS — T50904A Poisoning by unspecified drugs, medicaments and biological substances, undetermined, initial encounter: Secondary | ICD-10-CM

## 2013-12-03 DIAGNOSIS — Z91038 Other insect allergy status: Secondary | ICD-10-CM

## 2013-12-03 DIAGNOSIS — E032 Hypothyroidism due to medicaments and other exogenous substances: Secondary | ICD-10-CM

## 2013-12-03 DIAGNOSIS — Z800 Family history of malignant neoplasm of digestive organs: Secondary | ICD-10-CM

## 2013-12-03 DIAGNOSIS — Z0000 Encounter for general adult medical examination without abnormal findings: Secondary | ICD-10-CM

## 2013-12-03 DIAGNOSIS — Z91030 Bee allergy status: Secondary | ICD-10-CM

## 2013-12-03 DIAGNOSIS — E038 Other specified hypothyroidism: Secondary | ICD-10-CM

## 2013-12-03 DIAGNOSIS — E559 Vitamin D deficiency, unspecified: Secondary | ICD-10-CM

## 2013-12-04 ENCOUNTER — Encounter: Payer: Self-pay | Admitting: Physician Assistant

## 2013-12-04 ENCOUNTER — Encounter: Payer: Self-pay | Admitting: *Deleted

## 2013-12-04 MED ORDER — EPINEPHRINE 0.3 MG/0.3ML IJ SOAJ: 0.3000 mg | Freq: Once | INTRAMUSCULAR | Status: DC

## 2013-12-04 NOTE — Progress Notes (Signed)
Patient ID: Raven Mendoza MRN: [id number], DOB: Jan 29, 1961, 53 y.o. Date of Encounter: 12/04/2013,   Chief Complaint: Physical (CPE)  HPI: 53 y.o. y/o white female  here for CPE.  She has no complaints today.    Review of Systems: Consitutional: No fever, chills, fatigue, night sweats, lymphadenopathy. No significant/unexplained weight changes. Eyes: No visual changes, eye redness, or discharge. ENT/Mouth: No ear pain, sore throat, nasal drainage, or sinus pain. Cardiovascular: No chest pressure,heaviness, tightness or squeezing, even with exertion. No increased shortness of breath or dyspnea on exertion.No palpitations, edema, orthopnea, PND. Respiratory: No cough, hemoptysis, SOB, or wheezing. Gastrointestinal: No anorexia, dysphagia, reflux, pain, nausea, vomiting, hematemesis, diarrhea, constipation, BRBPR, or melena. Breast: No mass, nodules, bulging, or retraction. No skin changes or inflammation. No nipple discharge. No lymphadenopathy. Genitourinary: No dysuria, hematuria, incontinence, vaginal discharge, pruritis, burning, abnormal bleeding, or pain. Musculoskeletal: No decreased ROM, No joint pain or swelling. No significant pain in neck, back, or extremities. Skin: No rash, pruritis, or concerning lesions. Neurological: No headache, dizziness, syncope, seizures, tremors, memory loss, coordination problems, or paresthesias. Psychological: No anxiety, depression, hallucinations, SI/HI. Endocrine: No polydipsia, polyphagia, polyuria, or known diabetes.No increased fatigue. No palpitations/rapid heart rate. No significant/unexplained weight change. All other systems were reviewed and are otherwise negative.  Past Medical History  Diagnosis Date  . Hypothyroidism   . Arthritis   . Allergy to bee sting 04/20/2008    Anaphylaxis  . Hyperthyroidism      Past Surgical History  Procedure Laterality Date  . Radioactive iodine tx for thyroid  4 yrs ago,and  5 yrs ago  . C  sections  1987 and 1990  . Total hip arthroplasty  10/24/2011    right  . Total knee arthroplasty  01/16/2012    left  . Total knee arthroplasty  03/26/2012    Procedure: TOTAL KNEE ARTHROPLASTY;  Surgeon: Mauri Pole, MD;  Location: [facility];  Service: Orthopedics;  Laterality: Right;  . Knee closed reduction  03/26/2012    Procedure: CLOSED MANIPULATION KNEE;  Surgeon: Mauri Pole, MD;  Location: [facility];  Service: Orthopedics;  Laterality: Left;    Home Meds:  Current Outpatient Prescriptions on File Prior to Visit  Medication Sig Dispense Refill  . AMINO ACIDS COMPLEX PO Take 1 tablet by mouth daily.      . cholecalciferol (VITAMIN D) 1000 UNITS tablet Take 1,000 Units by mouth daily.      . cycloSPORINE (RESTASIS) 0.05 % ophthalmic emulsion Place 1 drop into both eyes 2 (two) times daily.      Marland Kitchen levothyroxine (SYNTHROID, LEVOTHROID) 125 MCG tablet Take 125 mcg by mouth daily before breakfast.      . meloxicam (MOBIC) 15 MG tablet take 1/2 tablet by mouth twice a day  30 tablet  3  . Polyethyl Glycol-Propyl Glycol (SYSTANE OP) Apply 1 drop to eye 2 (two) times daily as needed. Dry eyes       . diphenhydrAMINE (BENADRYL) 25 mg capsule Take 1 capsule (25 mg total) by mouth every 6 (six) hours as needed for itching, allergies or sleep.  30 capsule     No current facility-administered medications on file prior to visit.    Allergies:  Allergies  Allergen Reactions  . Penicillins Swelling    History   Social History  . Marital Status: Married    Spouse Name: N/A    Number of Children: N/A  . Years of Education: N/A   Occupational History  .  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 Smoking status: Never Smoker   . Smokeless tobacco: Never Used  . Alcohol Use: No  . Drug Use: No  . Sexual Activity: Not on file   Other Topics Concern  . Not on file   Social History Narrative  . No narrative on file    Family History  Problem Relation Age  of Onset  . Cancer Brother 20    colon cancer    Physical Exam: Blood pressure 120/78, pulse 76, temperature 97.3 F (36.3 C), temperature source Oral, resp. rate 18, height 5\' 2"  (1.575 m), weight 210 lb (95.255 kg), last menstrual period 10/23/2008., Body mass index is 38.4 kg/(m^2). General: Obese WF. Appears in no acute distress. HEENT: Normocephalic, atraumatic. Conjunctiva pink, sclera non-icteric. Pupils 2 mm constricting to 1 mm, round, regular, and equally reactive to light and accomodation. EOMI. Internal auditory canal clear. TMs with good cone of light and without pathology. Nasal mucosa pink. Nares are without discharge. No sinus tenderness. Oral mucosa pink.  Pharynx without exudate.   Neck: Supple. Trachea midline. No thyromegaly. Full ROM. No lymphadenopathy.No Carotid Bruits. Lungs: Clear to auscultation bilaterally without wheezes, rales, or rhonchi. Breathing is of normal effort and unlabored. Cardiovascular: RRR with S1 S2. No murmurs, rubs, or gallops. Distal pulses 2+ symmetrically. No carotid or abdominal bruits. Breast: Symmetrical. No masses. Nipples without discharge. Abdomen: Soft, non-tender, non-distended with normoactive bowel sounds. No hepatosplenomegaly or masses. No rebound/guarding. No CVA tenderness. No hernias.  Genitourinary:  External genitalia without lesions. Vaginal mucosa pink.No discharge present. Cervix pink and without discharge. No cervical tenderness.Normal uterus size. No adnexal mass or tenderness.  Pap smear taken Musculoskeletal: Full range of motion and 5/5 strength throughout. Without swelling, atrophy, tenderness, crepitus, or warmth. Extremities without clubbing, cyanosis, or edema. Calves supple. Skin: Warm and moist without erythema, ecchymosis, wounds, or rash. Neuro: A+Ox3. CN II-XII grossly intact. Moves all extremities spontaneously. Full sensation throughout. Normal gait. DTR 2+ throughout upper and lower extremities. Finger to nose  intact. Psych:  Responds to questions appropriately with a normal affect.   Assessment/Plan:  53 y.o. y/o female here for CPE here for CPE 1. Visit for preventive health examination  A. Screening Labs: She came in on 12/01/13 and have fasting labs. I reviewed these today at the visit. CMET. Normal. FLP is good. Triglycerides 53. HDL 80. LDL 106. Vitamin D is low at 24. CBC is normal.  B. Pap:  Last Pap was performed here 02/15/2010. It was cytology alone with no HPV. Therefore we'll go ahead and repeat Pap today with both cytology and HPV co-testing.  C. Screening Mammogram: She states that last mammogram was 2011. Agreeable for me to schedule followup mammogram.  D. DEXA/BMD:  Can wait until she is closer to age 65 to start doing this.  E. Colorectal Cancer Screening: She had a colonoscopy in July 2005 which was normal. She has a positive family history with a brother who had colon cancer at age 58. Therefore which she is supposed to be repeating every 5 years. However she has not had followup. She is agreeable to go for followup colonoscopy. I will place the order for referral today--see below.  F. Immunizations:  Influenza:  N/A for now.--no longer flu season Tetanus:  Last tetanus was given here November 2003. Due for update. Patient is agreeable so we'll give this now. Pneumococcal: She has no medical problems to indicate any need to get  this prior to age 26. Therefore we'll start this at age 69. Zostavax: We'll discuss at age 71.   - MM Digital Screening; Future - Ambulatory referral to Gastroenterology - PAP, Thin Prep w/HPV rflx HPV Type 16/18  2. Family history of colon cancer  3. Need for Tdap vaccination - Tdap vaccine greater than or equal to 7yo IM  4. Hyperthyroidism This is managed by Dr. Elyse Hsu. She still sees him on a routine basis.  5. Hypothyroidism due to medication This is managed by Dr. Elyse Hsu. She still sees him on a routine basis.  6. Vitamin D  deficiency Vitamin D is very low. She is to take 4000 units daily for one month and then go to 2000 units daily.  7. Allergy to bee sting She says that her EpiPen that she has is expired and needs a new prescription since then. I will order this now.   Darleny, Sem [place], [place], [facility] 12/04/2013 7:23 AM

## 2013-12-05 ENCOUNTER — Encounter: Payer: Self-pay | Admitting: Family Medicine

## 2013-12-05 LAB — PAP, THIN PREP W/HPV RFLX HPV TYPE 16/18: HPV DNA High Risk: NOT DETECTED

## 2014-03-10 ENCOUNTER — Encounter: Payer: Self-pay | Admitting: Family Medicine

## 2014-11-17 ENCOUNTER — Encounter: Payer: Self-pay | Admitting: Family Medicine

## 2014-11-17 ENCOUNTER — Other Ambulatory Visit: Payer: Self-pay | Admitting: Physician Assistant

## 2014-11-17 NOTE — Telephone Encounter (Signed)
Medication refill for one time only.  Patient needs to be seen.  Letter sent for patient to call and schedule 

## 2015-02-03 NOTE — Progress Notes (Signed)
Mammogram showing up in Overdue Results folder.  Call pt and find out mammogram. If she has not been reminded her to schedule this. If she has already had this-- or defers having this--, then document this in health maintenance.

## 2015-09-01 ENCOUNTER — Encounter: Payer: Self-pay | Admitting: Family Medicine

## 2016-10-30 ENCOUNTER — Telehealth: Payer: Self-pay | Admitting: Family Medicine

## 2016-10-30 DIAGNOSIS — E0500 Thyrotoxicosis with diffuse goiter without thyrotoxic crisis or storm: Secondary | ICD-10-CM

## 2016-10-30 DIAGNOSIS — E890 Postprocedural hypothyroidism: Secondary | ICD-10-CM

## 2016-10-30 NOTE — Telephone Encounter (Signed)
Had been seeing Dr Altheimer for her Thyroid.  She says his office has moved in [place] and is now further for her to go.  Wants to switch to Dr Dorris Fetch in [place].  Hasn't seen previous provider since 08/2015.  Referral into Dr Dorris Fetch.

## 2016-10-30 NOTE — Telephone Encounter (Signed)
Agree. Approved. 

## 2016-11-02 ENCOUNTER — Other Ambulatory Visit: Payer: Self-pay

## 2016-11-02 MED ORDER — LEVOTHYROXINE SODIUM 137 MCG PO TABS
137.0000 ug | ORAL_TABLET | Freq: Every day | ORAL | 0 refills | Status: DC
Start: 2016-11-02 — End: 2016-11-29

## 2016-11-02 NOTE — Telephone Encounter (Signed)
Pt is in the process of switching specialist and stated they would not refill her  Synthroid Rx, and when she called the new specialist pt states she was told they could not fill the rx until her appt on 3-14.  Refill was sent  for a 30 day supply pt is aware.

## 2016-11-08 ENCOUNTER — Encounter: Payer: Self-pay | Admitting: Physician Assistant

## 2016-11-08 ENCOUNTER — Ambulatory Visit (INDEPENDENT_AMBULATORY_CARE_PROVIDER_SITE_OTHER): Payer: BLUE CROSS/BLUE SHIELD | Admitting: Physician Assistant

## 2016-11-08 VITALS — BP 140/86 | HR 77 | Temp 98.0°F | Resp 16 | Wt 208.2 lb

## 2016-11-08 DIAGNOSIS — Z800 Family history of malignant neoplasm of digestive organs: Secondary | ICD-10-CM

## 2016-11-08 DIAGNOSIS — Z0000 Encounter for general adult medical examination without abnormal findings: Secondary | ICD-10-CM

## 2016-11-08 DIAGNOSIS — M1612 Unilateral primary osteoarthritis, left hip: Secondary | ICD-10-CM | POA: Diagnosis not present

## 2016-11-08 DIAGNOSIS — E032 Hypothyroidism due to medicaments and other exogenous substances: Secondary | ICD-10-CM | POA: Diagnosis not present

## 2016-11-08 DIAGNOSIS — E0590 Thyrotoxicosis, unspecified without thyrotoxic crisis or storm: Secondary | ICD-10-CM

## 2016-11-08 DIAGNOSIS — M25552 Pain in left hip: Secondary | ICD-10-CM | POA: Diagnosis not present

## 2016-11-08 DIAGNOSIS — E559 Vitamin D deficiency, unspecified: Secondary | ICD-10-CM

## 2016-11-08 MED ORDER — MELOXICAM 15 MG PO TABS: 7.5000 mg | ORAL_TABLET | Freq: Two times a day (BID) | ORAL | 0 refills | Status: DC

## 2016-11-08 NOTE — Progress Notes (Signed)
Patient ID: Raven Mendoza MRN: [id number], DOB: 1960-10-04, 56 y.o. Date of Encounter: 11/08/2016,   Chief Complaint: Physical (CPE)  HPI: 56 y.o. y/o white female  here for CPE.  She is seeing Dr. Eden Emms regarding her thyroid. "He has moved his office across town". Therefore she is changing to an [facility] in [name]. Has an appointment scheduled there for March 14.  She has had 2 knee replacements and one hip replacement by Dr. Alvan Dame. Other hip (left hip) is starting to feel like the other one did. Wants to see Dr. Alvan Dame to get this hip replaced. Also needs a refill on mobic.  She can return fasting for lab work.  She is overdue for mammogram. Has not had in well over a year. Is agreeable for me to schedule.  Reviewed that at her last physical with me, I had put in Referral to GI for colonoscopy. 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  No other concerns to address today.  Otherwise, has been stable from medical standpoint.   Review of Systems: Consitutional: No fever, chills, fatigue, night sweats, lymphadenopathy. No significant/unexplained weight changes. Eyes: No visual changes, eye redness, or discharge. ENT/Mouth: No ear pain, sore throat, nasal drainage, or sinus pain. Cardiovascular: No chest pressure,heaviness, tightness or squeezing, even with exertion. No increased shortness of breath or dyspnea on exertion.No palpitations, edema, orthopnea, PND. Respiratory: No cough, hemoptysis, SOB, or wheezing. Gastrointestinal: No anorexia, dysphagia, reflux, pain, nausea, vomiting, hematemesis, diarrhea, constipation, BRBPR, or melena. Breast: No mass, nodules, bulging, or retraction. No skin changes or inflammation. No nipple discharge. No lymphadenopathy. Genitourinary: No dysuria, hematuria, incontinence, vaginal discharge,  pruritis, burning, abnormal bleeding, or pain. Musculoskeletal: No decreased ROM, No joint pain or swelling. No significant pain in neck, back, or extremities. Skin: No rash, pruritis, or concerning lesions. Neurological: No headache, dizziness, syncope, seizures, tremors, memory loss, coordination problems, or paresthesias. Psychological: No anxiety, depression, hallucinations, SI/HI. Endocrine: No polydipsia, polyphagia, polyuria, or known diabetes.No increased fatigue. No palpitations/rapid heart rate. No significant/unexplained weight change. All other systems were reviewed and are otherwise negative.  Past Medical History:  Diagnosis Date  . Allergy to bee sting 04/20/2008   Anaphylaxis  . Arthritis   . Hyperthyroidism   . Hypothyroidism      Past Surgical History:  Procedure Laterality Date  . c sections  1987 and 1990  . KNEE CLOSED REDUCTION  03/26/2012   Procedure: CLOSED MANIPULATION KNEE;  Surgeon: Mauri Pole, MD;  Location: [facility];  Service: Orthopedics;  Laterality: Left;  . radioactive iodine tx for thyroid  4 yrs ago,and  5 yrs ago  . TOTAL HIP ARTHROPLASTY  10/24/2011   right  . TOTAL KNEE ARTHROPLASTY  01/16/2012   left  . TOTAL KNEE ARTHROPLASTY  03/26/2012   Procedure: TOTAL KNEE ARTHROPLASTY;  Surgeon: Mauri Pole, MD;  Location: [facility];  Service: Orthopedics;  Laterality: Right;    Home Meds:  Current Outpatient Prescriptions on File Prior to Visit  Medication Sig Dispense Refill  . AMINO ACIDS COMPLEX PO Take 1 tablet by mouth daily.    . cholecalciferol (VITAMIN D) 1000 UNITS tablet Take 1,000 Units by mouth daily.    Marland Kitchen EPINEPHrine (EPI-PEN) 0.3 mg/0.3 mL SOAJ injection Inject 0.3 mLs (0.3 mg total) into the muscle once. 2 Device 1  .  levothyroxine (SYNTHROID, LEVOTHROID) 137 MCG tablet Take 1 tablet (137 mcg total) by mouth daily before breakfast. 30 tablet 0  . Polyethyl Glycol-Propyl Glycol (SYSTANE OP) Apply 1 drop to eye 2 (two) times daily as needed.  Dry eyes     . cycloSPORINE (RESTASIS) 0.05 % ophthalmic emulsion Place 1 drop into both eyes 2 (two) times daily.    . diphenhydrAMINE (BENADRYL) 25 mg capsule Take 1 capsule (25 mg total) by mouth every 6 (six) hours as needed for itching, allergies or sleep. 30 capsule   . meloxicam (MOBIC) 15 MG tablet TAKE 1/2 TABLET BY MOUTH TWICE DAILY (Patient not taking: Reported on 11/08/2016) 30 tablet 0   No current facility-administered medications on file prior to visit.     Allergies:  Allergies  Allergen Reactions  . Penicillins Swelling    History   Social History  . Marital Status: Married    Spouse Name: N/A    Number of Children: N/A  . Years of Education: N/A   Occupational History  . 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 Smoking status: Never Smoker   . Smokeless tobacco: Never Used  . Alcohol Use: No  . Drug Use: No  . Sexual Activity: Not on file   Other Topics Concern  . Not on file   Social History Narrative  . No narrative on file    Family History  Problem Relation Age of Onset  . Cancer Brother 43    colon cancer    Physical Exam: Blood pressure 140/86, pulse 77, temperature 98 F (36.7 C), temperature source Oral, resp. rate 16, weight 208 lb 3.2 oz (94.4 kg), last menstrual period 10/23/2008, SpO2 97 %., Body mass index is 38.08 kg/m. General: Obese WF. Appears in no acute distress. HEENT: Normocephalic, atraumatic. Conjunctiva pink, sclera non-icteric. Pupils 2 mm constricting to 1 mm, round, regular, and equally reactive to light and accomodation. EOMI. Internal auditory canal clear. TMs with good cone of light and without pathology. Nasal mucosa pink. Nares are without discharge. No sinus tenderness. Oral mucosa pink.  Pharynx without exudate.   Neck: Supple. Trachea midline. No thyromegaly. Full ROM. No lymphadenopathy.No Carotid Bruits. Lungs: Clear to auscultation bilaterally without wheezes, rales, or rhonchi.  Breathing is of normal effort and unlabored. Cardiovascular: RRR with S1 S2. No murmurs, rubs, or gallops. Distal pulses 2+ symmetrically. No carotid or abdominal bruits. Breast: Symmetrical. No masses. Nipples without discharge. Abdomen: Soft, non-tender, non-distended with normoactive bowel sounds. No hepatosplenomegaly or masses. No rebound/guarding. No CVA tenderness. No hernias.  Genitourinary:  External genitalia without lesions. Vaginal mucosa pink.No discharge present. Cervix pink and without discharge. No cervical tenderness.Normal uterus size. No adnexal mass or tenderness.   Musculoskeletal: Decreased ROM of left hip. Skin: Warm and moist without erythema, ecchymosis, wounds, or rash. Neuro: A+Ox3. CN II-XII grossly intact. Moves all extremities spontaneously. Full sensation throughout. Normal gait. DTR 2+ throughout upper and lower extremities.  Psych:  Responds to questions appropriately with a normal affect.   Assessment/Plan:  56 y.o. y/o female here for CPE  1. Visit for preventive health examination  A. Screening Labs: She will return fasting for labs. Will not check TSH as thyroid managed by Endocrinology.   B. Pap:  Last Pap was performed here 11/2013---HPV -, Cytology -. Repeat 3 -5 years---due b/t 11/2016 - 11/2018  C. Screening Mammogram: She states that last mammogram > 1 year ago. Agreeable for me to schedule  followup mammogram.  D. DEXA/BMD:  Can wait until she is closer to age 74 to start doing this.  E. Colorectal Cancer Screening: She had a colonoscopy in July 2005 which was normal. She has a positive family history with a brother who had colon cancer at age 41. Therefore which she is supposed to be repeating every 5 years. However she has not had followup. She is agreeable to go for followup colonoscopy. I will place the order for referral today--see below.  F. Immunizations:  Influenza:  N/A for now.--no longer flu season Tetanus:  Tdap Given here  11/2013 Pneumococcal: She has no medical problems to indicate any need to get this prior to age 46. Therefore we'll start this at age 60. Zostavax: We'll discuss at age 21.   Vitamin D deficiency - VITAMIN D 25 Hydroxy (Vit-D Deficiency, Fractures); Future  Family history of colon cancer - Ambulatory referral to Gastroenterology  Osteoarthritis of left hip, unspecified osteoarthritis type - Ambulatory referral to Orthopedic Surgery---comment that this is best to be with Dr. Alvan Dame in [facility] orthopedics. - meloxicam (MOBIC) 15 MG tablet; Take 0.5 tablets (7.5 mg total) by mouth 2 (two) times daily.  Dispense: 30 tablet; Refill: 0  Left hip pain - Ambulatory referral to Orthopedic Surgery - meloxicam (MOBIC) 15 MG tablet; Take 0.5 tablets (7.5 mg total) by mouth 2 (two) times daily.  Dispense: 30 tablet; Refill: 0  Family history of colon cancer  Hyperthyroidism This is managed by Endocrinology  Hypothyroidism due to medication This is managed by Endocrinology  Allergy to bee sting -- EpiPen as directed   Signed, Karis Juba, [place], [facility] 11/08/2016 3:10 PM

## 2016-11-10 ENCOUNTER — Other Ambulatory Visit: Payer: Self-pay

## 2016-11-10 DIAGNOSIS — E559 Vitamin D deficiency, unspecified: Secondary | ICD-10-CM

## 2016-11-10 DIAGNOSIS — Z0000 Encounter for general adult medical examination without abnormal findings: Secondary | ICD-10-CM

## 2016-11-10 LAB — CBC WITH DIFFERENTIAL/PLATELET
BASOS ABS: 63 {cells}/uL (ref 0–200)
BASOS PCT: 1 %
Eosinophils Absolute: 126 cells/uL (ref 15–500)
Eosinophils Relative: 2 %
HCT: 42.2 % (ref 35.0–45.0)
HEMOGLOBIN: 14.1 g/dL (ref 12.0–15.0)
LYMPHS ABS: 1449 {cells}/uL (ref 850–3900)
Lymphocytes Relative: 23 %
MCH: 29.8 pg (ref 27.0–33.0)
MCHC: 33.4 g/dL (ref 32.0–36.0)
MCV: 89.2 fL (ref 80.0–100.0)
MONO ABS: 630 {cells}/uL (ref 200–950)
MONOS PCT: 10 %
MPV: 10.4 fL (ref 7.5–12.5)
NEUTROS ABS: 4032 {cells}/uL (ref 1500–7800)
Neutrophils Relative %: 64 %
PLATELETS: 301 10*3/uL (ref 140–400)
RBC: 4.73 MIL/uL (ref 3.80–5.10)
RDW: 15 % (ref 11.0–15.0)
WBC: 6.3 10*3/uL (ref 3.8–10.8)

## 2016-11-10 LAB — COMPLETE METABOLIC PANEL WITH GFR
ALBUMIN: 4.4 g/dL (ref 3.6–5.1)
ALK PHOS: 65 U/L (ref 33–130)
ALT: 17 U/L (ref 6–29)
AST: 25 U/L (ref 10–35)
BILIRUBIN TOTAL: 0.5 mg/dL (ref 0.2–1.2)
BUN: 16 mg/dL (ref 7–25)
CO2: 24 mmol/L (ref 20–31)
CREATININE: 0.78 mg/dL (ref 0.50–1.05)
Calcium: 9.7 mg/dL (ref 8.6–10.4)
Chloride: 105 mmol/L (ref 98–110)
GFR, Est Non African American: 85 mL/min (ref >=60)
Glucose, Bld: 99 mg/dL (ref 70–99)
Potassium: 4.4 mmol/L (ref 3.5–5.3)
Sodium: 140 mmol/L (ref 135–146)
TOTAL PROTEIN: 7.4 g/dL (ref 6.1–8.1)

## 2016-11-10 LAB — LIPID PANEL
Cholesterol: 204 mg/dL — ABNORMAL HIGH (ref <200)
HDL: 101 mg/dL (ref >50)
LDL CALC: 93 mg/dL (ref <100)
TRIGLYCERIDES: 51 mg/dL (ref <150)
Total CHOL/HDL Ratio: 2 Ratio (ref <5.0)
VLDL: 10 mg/dL (ref <30)

## 2016-11-11 LAB — VITAMIN D 25 HYDROXY (VIT D DEFICIENCY, FRACTURES): Vit D, 25-Hydroxy: 23 ng/mL — ABNORMAL LOW (ref 30–100)

## 2016-11-15 ENCOUNTER — Other Ambulatory Visit: Payer: Self-pay

## 2016-11-15 MED ORDER — VITAMIN D 1000 UNITS PO TABS: 2000.0000 [IU] | ORAL_TABLET | Freq: Every day | ORAL | Status: DC

## 2016-11-23 ENCOUNTER — Other Ambulatory Visit: Payer: Self-pay | Admitting: Physician Assistant

## 2016-11-23 DIAGNOSIS — Z1231 Encounter for screening mammogram for malignant neoplasm of breast: Secondary | ICD-10-CM

## 2016-11-29 ENCOUNTER — Ambulatory Visit (INDEPENDENT_AMBULATORY_CARE_PROVIDER_SITE_OTHER): Payer: BLUE CROSS/BLUE SHIELD | Admitting: "Endocrinology

## 2016-11-29 ENCOUNTER — Other Ambulatory Visit: Payer: Self-pay

## 2016-11-29 ENCOUNTER — Encounter: Payer: Self-pay | Admitting: "Endocrinology

## 2016-11-29 VITALS — BP 135/82 | HR 76 | Ht 62.0 in | Wt 214.0 lb

## 2016-11-29 DIAGNOSIS — E559 Vitamin D deficiency, unspecified: Secondary | ICD-10-CM | POA: Diagnosis not present

## 2016-11-29 DIAGNOSIS — E890 Postprocedural hypothyroidism: Secondary | ICD-10-CM

## 2016-11-29 MED ORDER — VITAMIN D3 125 MCG (5000 UT) PO CAPS: 5000.0000 [IU] | ORAL_CAPSULE | Freq: Every day | ORAL | 0 refills | Status: DC

## 2016-11-29 MED ORDER — LEVOTHYROXINE SODIUM 137 MCG PO TABS
137.0000 ug | ORAL_TABLET | Freq: Every day | ORAL | 0 refills | Status: DC
Start: 2016-11-29 — End: 2016-11-29

## 2016-11-29 MED ORDER — LEVOTHYROXINE SODIUM 137 MCG PO TABS: 137.0000 ug | ORAL_TABLET | Freq: Every day | ORAL | 2 refills | Status: DC

## 2016-11-29 NOTE — Progress Notes (Signed)
Subjective:    Patient ID: Raven Mendoza, female    DOB: 07/02/1961, PCP Raven Juba, PA-C   Past Medical History:  Diagnosis Date  . Allergy to bee sting 04/20/2008   Anaphylaxis  . Arthritis   . Hyperthyroidism   . Hypothyroidism    Past Surgical History:  Procedure Laterality Date  . c sections  1987 and 1990  . KNEE CLOSED REDUCTION  03/26/2012   Procedure: CLOSED MANIPULATION KNEE;  Surgeon: Raven Pole, MD;  Location: [facility];  Service: Orthopedics;  Laterality: Left;  . radioactive iodine tx for thyroid  4 yrs ago,and  5 yrs ago  . TOTAL HIP ARTHROPLASTY  10/24/2011   right  . TOTAL KNEE ARTHROPLASTY  01/16/2012   left  . TOTAL KNEE ARTHROPLASTY  03/26/2012   Procedure: TOTAL KNEE ARTHROPLASTY;  Surgeon: Raven Pole, MD;  Location: [facility];  Service: Orthopedics;  Laterality: Right;   Social History   Social History  . Marital status: Married    Spouse name: N/A  . Number of children: N/A  . Years of education: N/A   Social History Main Topics  . Smoking status: Never Smoker  . Smokeless tobacco: Never Used  . Alcohol use No  . Drug use: No  . Sexual activity: Not on file   Other Topics Concern  . 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   Outpatient Encounter Prescriptions as of 11/29/2016  Medication Sig  . AMINO ACIDS COMPLEX PO Take 1 tablet by mouth daily.  . Cholecalciferol (VITAMIN D3) 5000 units CAPS Take 1 capsule (5,000 Units total) by mouth daily.  . cycloSPORINE (RESTASIS) 0.05 % ophthalmic emulsion Place 1 drop into both eyes 2 (two) times daily.  . diphenhydrAMINE (BENADRYL) 25 mg capsule Take 1 capsule (25 mg total) by mouth every 6 (six) hours as needed for itching, allergies or sleep.  Marland Kitchen EPINEPHrine (EPI-PEN) 0.3 mg/0.3 mL SOAJ injection Inject 0.3 mLs (0.3 mg total) into the muscle once.  . meloxicam (MOBIC) 15 MG tablet Take 0.5 tablets (7.5 mg total) by  mouth 2 (two) times daily.  Raven Mendoza Glycol-Propyl Glycol (SYSTANE OP) Apply 1 drop to eye 2 (two) times daily as needed. Dry eyes   . [DISCONTINUED] cholecalciferol (VITAMIN D) 1000 units tablet Take 2 tablets (2,000 Units total) by mouth daily.  . [DISCONTINUED] levothyroxine (SYNTHROID, LEVOTHROID) 137 MCG tablet Take 1 tablet (137 mcg total) by mouth daily before breakfast.  . [DISCONTINUED] levothyroxine (SYNTHROID, LEVOTHROID) 137 MCG tablet Take 1 tablet (137 mcg total) by mouth daily before breakfast.   No facility-administered encounter medications on file as of 11/29/2016.    ALLERGIES: Allergies  Allergen Reactions  . Penicillins Swelling    VACCINATION STATUS: Immunization History  Administered Date(s) Administered  . Td 08/12/2002  . Tdap 12/03/2013    HPI  56 year old female patient with medical history as above. 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 This consult is requested by her PCP  [facility] BETH, PA-C.  - Review of her medical records show that she did receive I-131 therapy on 2 occasions (18.2 mCi on 09/19/2005 and 26.1 mCi on 04/04/2006 due to refractory Graves' disease). - She was started on thyroid hormone which was adjusted over the years to current level of 137 g by mouth every morning. She reports compliance to this medication. She  does not have recent thyroid function tests to review. She denies heat nor cold intolerance. She denies dysphagia, shortness of breath, voice change. She has significant family history of various thyroid dysfunctions. She denies any family history of thyroid cancer. -She denies any recent major factors in her weight.  Review of Systems  Constitutional: no weight gain/loss, no fatigue, no subjective hyperthermia, no subjective hypothermia Eyes: no blurry vision, no xerophthalmia ENT: no sore throat, no nodules palpated in throat, no dysphagia/odynophagia, no hoarseness Cardiovascular: no Chest  Pain, no Shortness of Breath, no palpitations, no leg swelling Respiratory: no cough, no SOB Gastrointestinal: no Nausea/Vomiting/Diarhhea Musculoskeletal: no muscle/joint aches Skin: no rashes Neurological: no tremors, no numbness, no tingling, no dizziness Psychiatric: no depression, no anxiety  Objective:    BP 135/82   Pulse 76   Ht 5\' 2"  (1.575 m)   Wt 214 lb (97.1 kg)   LMP 10/23/2008   BMI 39.14 kg/m   Wt Readings from Last 3 Encounters:  11/29/16 214 lb (97.1 kg)  11/08/16 208 lb 3.2 oz (94.4 kg)  12/03/13 210 lb (95.3 kg)    Physical Exam  Constitutional: Significantly over weight for hight, not in acute distress, normal state of mind Eyes: PERRLA, EOMI, no exophthalmos ENT: moist mucous membranes, no thyromegaly, no cervical lymphadenopathy Cardiovascular: normal precordial activity, Regular Rate and Rhythm, no Murmur/Rubs/Gallops Respiratory:  adequate breathing efforts, no gross chest deformity, Clear to auscultation bilaterally Gastrointestinal: abdomen soft, Non -tender, No distension, Bowel Sounds present Musculoskeletal: no gross deformities, strength intact in all four extremities Skin: moist, warm, no rashes Neurological: no tremor with outstretched hands, Deep tendon reflexes normal in all four extremities.  CMP ( most recent) CMP     Component Value Date/Time   NA 140 11/10/2016 0822   K 4.4 11/10/2016 0822   CL 105 11/10/2016 0822   CO2 24 11/10/2016 0822   GLUCOSE 99 11/10/2016 0822   BUN 16 11/10/2016 0822   CREATININE 0.78 11/10/2016 0822   CALCIUM 9.7 11/10/2016 0822   PROT 7.4 11/10/2016 0822   ALBUMIN 4.4 11/10/2016 0822   AST 25 11/10/2016 0822   ALT 17 11/10/2016 0822   ALKPHOS 65 11/10/2016 0822   BILITOT 0.5 11/10/2016 0822   GFRNONAA 85 11/10/2016 0822   GFRAA >89 11/10/2016 0822    Lipid Panel     Component Value Date/Time   CHOL 204 (H) 11/10/2016 0822   TRIG 51 11/10/2016 0822   HDL 101 11/10/2016 0822   CHOLHDL 2.0  11/10/2016 0822   VLDL 10 11/10/2016 0822   LDLCALC 93 11/10/2016 0822      Assessment & Plan:   1. Hypothyroidism following radioiodine therapy - I have reviewed her medical records and clinically evaluated patient. 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 Her current dose of synthroid is 137 g. She does not have recent thyroid function tests to make a decision for dose adjustment. I will send her to lab for new set of labs including TSH and free T4. The meantime I advised her to continue Synthroid 137 pg by mouth every morning.  - We discussed about correct intake of levothyroxine, at fasting, with water, separated by at least 30 minutes from breakfast, and separated by more than 4 hours from calcium, iron, multivitamins, acid reflux medications (PPIs). -Patient is made aware of the fact that thyroid hormone replacement is needed for life, dose to be adjusted by periodic monitoring of thyroid function tests. - She does not  have clinical goiter, no need for thyroid imaging at this time. 2. Vitamin D deficiency - I advised her to increase her vitamin D to 5000 units daily. She is 10 years postmenopausal with history of multiple joint replacements due to osteoarthritis. I offered her screening bone density.  - I advised patient to maintain close follow up with [facility] BETH, PA-C for primary care needs. Follow up plan: Return in about 1 week (around 12/06/2016) for labs today.  Glade Lloyd, MD Phone: [phone]  Fax: [phone]   11/29/2016, 3:10 PM

## 2016-11-30 LAB — T4, FREE: Free T4: 1.5 ng/dL (ref 0.8–1.8)

## 2016-11-30 LAB — TSH: TSH: 0.7 mIU/L

## 2016-12-06 ENCOUNTER — Ambulatory Visit (INDEPENDENT_AMBULATORY_CARE_PROVIDER_SITE_OTHER): Payer: BLUE CROSS/BLUE SHIELD | Admitting: "Endocrinology

## 2016-12-06 ENCOUNTER — Encounter: Payer: Self-pay | Admitting: "Endocrinology

## 2016-12-06 VITALS — BP 129/86 | HR 62 | Ht 62.0 in | Wt 211.0 lb

## 2016-12-06 DIAGNOSIS — E890 Postprocedural hypothyroidism: Secondary | ICD-10-CM

## 2016-12-06 DIAGNOSIS — E559 Vitamin D deficiency, unspecified: Secondary | ICD-10-CM | POA: Diagnosis not present

## 2016-12-06 MED ORDER — LEVOTHYROXINE SODIUM 137 MCG PO TABS: 137.0000 ug | ORAL_TABLET | Freq: Every day | ORAL | 2 refills | Status: DC

## 2016-12-06 NOTE — Progress Notes (Signed)
Subjective:    Patient ID: Raven Mendoza, female    DOB: 06/18/1961, PCP Karis Juba, PA-C   Past Medical History:  Diagnosis Date  . Allergy to bee sting 04/20/2008   Anaphylaxis  . Arthritis   . Hyperthyroidism   . Hypothyroidism    Past Surgical History:  Procedure Laterality Date  . c sections  1987 and 1990  . KNEE CLOSED REDUCTION  03/26/2012   Procedure: CLOSED MANIPULATION KNEE;  Surgeon: Mauri Pole, MD;  Location: [facility];  Service: Orthopedics;  Laterality: Left;  . radioactive iodine tx for thyroid  4 yrs ago,and  5 yrs ago  . TOTAL HIP ARTHROPLASTY  10/24/2011   right  . TOTAL KNEE ARTHROPLASTY  01/16/2012   left  . TOTAL KNEE ARTHROPLASTY  03/26/2012   Procedure: TOTAL KNEE ARTHROPLASTY;  Surgeon: Mauri Pole, MD;  Location: [facility];  Service: Orthopedics;  Laterality: Right;   Social History   Social History  . Marital status: Married    Spouse name: N/A  . Number of children: N/A  . Years of education: N/A   Social History Main Topics  . Smoking status: Never Smoker  . Smokeless tobacco: Never Used  . Alcohol use No  . Drug use: No  . Sexual activity: Not Asked   Other Topics Concern  . 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    She is active and involved with the farm.   Outpatient Encounter Prescriptions as of 12/06/2016  Medication Sig  . AMINO ACIDS COMPLEX PO Take 1 tablet by mouth daily.  . Cholecalciferol (VITAMIN D3) 5000 units CAPS Take 1 capsule (5,000 Units total) by mouth daily.  . cycloSPORINE (RESTASIS) 0.05 % ophthalmic emulsion Place 1 drop into both eyes 2 (two) times daily.  . diphenhydrAMINE (BENADRYL) 25 mg capsule Take 1 capsule (25 mg total) by mouth every 6 (six) hours as needed for itching, allergies or sleep.  Marland Kitchen EPINEPHrine (EPI-PEN) 0.3 mg/0.3 mL SOAJ injection Inject 0.3 mLs (0.3 mg total) into the muscle once.  Marland Kitchen levothyroxine (SYNTHROID, LEVOTHROID) 137 MCG tablet Take 1 tablet (137 mcg  total) by mouth daily before breakfast.  . meloxicam (MOBIC) 15 MG tablet Take 0.5 tablets (7.5 mg total) by mouth 2 (two) times daily.  Vladimir Faster Glycol-Propyl Glycol (SYSTANE OP) Apply 1 drop to eye 2 (two) times daily as needed. Dry eyes   . [DISCONTINUED] levothyroxine (SYNTHROID, LEVOTHROID) 137 MCG tablet Take 1 tablet (137 mcg total) by mouth daily before breakfast.   No facility-administered encounter medications on file as of 12/06/2016.    ALLERGIES: Allergies  Allergen Reactions  . Penicillins Swelling    VACCINATION STATUS: Immunization History  Administered Date(s) Administered  . Td 08/12/2002  . Tdap 12/03/2013    HPI  56 year old female patient with medical history as above. 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 This consult is requested by her PCP  [facility] BETH, PA-C.  - Review of her medical records show that she did receive I-131 therapy on 2 occasions (18.2 mCi on 09/19/2005 and 26.1 mCi on 04/04/2006 due to refractory Graves' disease). - She was started on thyroid hormone which was adjusted over the years to current level of 137 g by mouth every morning. She reports compliance to this medication.   She denies heat nor cold intolerance. She denies dysphagia, shortness of breath, voice change. She has significant family history of  various thyroid dysfunctions. She denies any family history of thyroid cancer. -She denies any recent major Change in her weight.  Review of Systems  Constitutional: no weight gain/loss, no fatigue, no subjective hyperthermia, no subjective hypothermia Eyes: no blurry vision, no xerophthalmia ENT: no sore throat, no nodules palpated in throat, no dysphagia/odynophagia, no hoarseness Cardiovascular: no Chest Pain, no Shortness of Breath, no palpitations, no leg swelling Respiratory: no cough, no SOB Gastrointestinal: no Nausea/Vomiting/Diarhhea Musculoskeletal: no muscle/joint aches Skin: no  rashes Neurological: no tremors, no numbness, no tingling, no dizziness Psychiatric: no depression, no anxiety  Objective:    BP 129/86   Pulse 62   Ht 5\' 2"  (1.575 m)   Wt 211 lb (95.7 kg)   LMP 10/23/2008   BMI 38.59 kg/m   Wt Readings from Last 3 Encounters:  12/06/16 211 lb (95.7 kg)  11/29/16 214 lb (97.1 kg)  11/08/16 208 lb 3.2 oz (94.4 kg)    Physical Exam  Constitutional: Significantly over weight for hight, not in acute distress, normal state of mind Eyes: PERRLA, EOMI, no exophthalmos ENT: moist mucous membranes, no thyromegaly, no cervical lymphadenopathy Cardiovascular: normal precordial activity, Regular Rate and Rhythm, no Murmur/Rubs/Gallops Respiratory:  adequate breathing efforts, no gross chest deformity, Clear to auscultation bilaterally Gastrointestinal: abdomen soft, Non -tender, No distension, Bowel Sounds present Musculoskeletal: no gross deformities, strength intact in all four extremities Skin: moist, warm, no rashes Neurological: no tremor with outstretched hands, Deep tendon reflexes normal in all four extremities.  CMP ( most recent) CMP     Component Value Date/Time   NA 140 11/10/2016 0822   K 4.4 11/10/2016 0822   CL 105 11/10/2016 0822   CO2 24 11/10/2016 0822   GLUCOSE 99 11/10/2016 0822   BUN 16 11/10/2016 0822   CREATININE 0.78 11/10/2016 0822   CALCIUM 9.7 11/10/2016 0822   PROT 7.4 11/10/2016 0822   ALBUMIN 4.4 11/10/2016 0822   AST 25 11/10/2016 0822   ALT 17 11/10/2016 0822   ALKPHOS 65 11/10/2016 0822   BILITOT 0.5 11/10/2016 0822   GFRNONAA 85 11/10/2016 0822   GFRAA >89 11/10/2016 0822    Lipid Panel     Component Value Date/Time   CHOL 204 (H) 11/10/2016 0822   TRIG 51 11/10/2016 0822   HDL 101 11/10/2016 0822   CHOLHDL 2.0 11/10/2016 0822   VLDL 10 11/10/2016 0822   LDLCALC 93 11/10/2016 0822      Assessment & Plan:   1. Hypothyroidism following radioiodine therapy - She has RAI induced hypothyroidism.  Based on her recent thyroid function test her current dose of Synthroid at 137 g by mouth every morning is the correct dose.   - We discussed about correct intake of levothyroxine, at fasting, with water, separated by at least 30 minutes from breakfast, and separated by more than 4 hours from calcium, iron, multivitamins, acid reflux medications (PPIs). -Patient is made aware of the fact that thyroid hormone replacement is needed for life, dose to be adjusted by periodic monitoring of thyroid function tests. - She does not have clinical goiter, no need for thyroid imaging at this time. 2. Vitamin D deficiency - I advised her to increase her vitamin D to 5000 units daily. She is 10 years postmenopausal with history of multiple joint replacements due to osteoarthritis. I offered her screening bone density- which is pending.  - I advised patient to maintain close follow up with [facility] BETH, PA-C for primary care needs. Follow up plan: Return  in about 6 months (around 06/08/2017) for follow up with pre-visit labs, Bone Density.  Glade Lloyd, MD Phone: [phone]  Fax: [phone]   12/06/2016, 4:15 PM

## 2016-12-12 ENCOUNTER — Ambulatory Visit
Admission: RE | Admit: 2016-12-12 | Discharge: 2016-12-12 | Disposition: A | Discharge status: Home / Self Care | Payer: BLUE CROSS/BLUE SHIELD | Source: Ambulatory Visit | Attending: Physician Assistant | Admitting: Physician Assistant

## 2016-12-12 DIAGNOSIS — Z1231 Encounter for screening mammogram for malignant neoplasm of breast: Secondary | ICD-10-CM

## 2016-12-12 LAB — HM MAMMOGRAPHY

## 2016-12-19 ENCOUNTER — Other Ambulatory Visit: Payer: Self-pay | Admitting: Physician Assistant

## 2016-12-19 DIAGNOSIS — M1612 Unilateral primary osteoarthritis, left hip: Secondary | ICD-10-CM

## 2016-12-19 DIAGNOSIS — M25552 Pain in left hip: Secondary | ICD-10-CM

## 2016-12-19 NOTE — Telephone Encounter (Signed)
Refill appropriate 

## 2017-06-12 ENCOUNTER — Ambulatory Visit: Payer: BLUE CROSS/BLUE SHIELD | Admitting: "Endocrinology

## 2017-06-26 ENCOUNTER — Other Ambulatory Visit: Payer: Self-pay | Admitting: "Endocrinology

## 2017-06-26 ENCOUNTER — Ambulatory Visit: Payer: Self-pay | Admitting: "Endocrinology

## 2017-06-26 DIAGNOSIS — E039 Hypothyroidism, unspecified: Secondary | ICD-10-CM

## 2017-07-03 LAB — T4, FREE: FREE T4: 1.5 ng/dL (ref 0.8–1.8)

## 2017-07-03 LAB — TSH: TSH: 0.73 mIU/L (ref 0.40–4.50)

## 2017-07-13 ENCOUNTER — Other Ambulatory Visit: Payer: Self-pay | Admitting: Physician Assistant

## 2017-07-13 DIAGNOSIS — M1612 Unilateral primary osteoarthritis, left hip: Secondary | ICD-10-CM

## 2017-07-13 DIAGNOSIS — M25552 Pain in left hip: Secondary | ICD-10-CM

## 2017-07-13 NOTE — Telephone Encounter (Signed)
Medication refilled per protocol. 

## 2017-07-17 ENCOUNTER — Encounter: Payer: Self-pay | Admitting: "Endocrinology

## 2017-07-17 ENCOUNTER — Ambulatory Visit (INDEPENDENT_AMBULATORY_CARE_PROVIDER_SITE_OTHER): Payer: Self-pay | Admitting: "Endocrinology

## 2017-07-17 VITALS — BP 130/84 | HR 79 | Ht 62.0 in | Wt 213.0 lb

## 2017-07-17 DIAGNOSIS — E559 Vitamin D deficiency, unspecified: Secondary | ICD-10-CM

## 2017-07-17 DIAGNOSIS — E890 Postprocedural hypothyroidism: Secondary | ICD-10-CM

## 2017-07-17 MED ORDER — LEVOTHYROXINE SODIUM 137 MCG PO TABS
137.0000 ug | ORAL_TABLET | Freq: Every day | ORAL | 4 refills | Status: DC
Start: 2017-07-17 — End: 2018-03-01

## 2017-07-17 NOTE — Progress Notes (Signed)
Subjective:    Patient ID: Raven Mendoza, female    DOB: [birth year], PCP Orlena Sheldon, PA-C   Past Medical History:  Diagnosis Date  . Allergy to bee sting 04/20/2008   Anaphylaxis  . Arthritis   . Hyperthyroidism   . Hypothyroidism    Past Surgical History:  Procedure Laterality Date  . c sections  1987 and 1990  . KNEE CLOSED REDUCTION  03/26/2012   Procedure: CLOSED MANIPULATION KNEE;  Surgeon: Mauri Pole, MD;  Location: [facility];  Service: Orthopedics;  Laterality: Left;  . radioactive iodine tx for thyroid  4 yrs ago,and  5 yrs ago  . TOTAL HIP ARTHROPLASTY  10/24/2011   right  . TOTAL KNEE ARTHROPLASTY  01/16/2012   left  . TOTAL KNEE ARTHROPLASTY  03/26/2012   Procedure: TOTAL KNEE ARTHROPLASTY;  Surgeon: Mauri Pole, MD;  Location: [facility];  Service: Orthopedics;  Laterality: Right;   Social History   Social History  . Marital status: Married    Spouse name: N/A  . Number of children: N/A  . Years of education: N/A   Social History Main Topics  . Smoking status: Never Smoker  . Smokeless tobacco: Never Used  . Alcohol use No  . Drug use: No  . Sexual activity: Not on file   Other Topics Concern  . 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   Outpatient Encounter Prescriptions as of 07/17/2017  Medication Sig  . AMINO ACIDS COMPLEX PO Take 1 tablet by mouth daily.  . Cholecalciferol (VITAMIN D3) 5000 units CAPS Take 1 capsule (5,000 Units total) by mouth daily.  . cycloSPORINE (RESTASIS) 0.05 % ophthalmic emulsion Place 1 drop into both eyes 2 (two) times daily.  . diphenhydrAMINE (BENADRYL) 25 mg capsule Take 1 capsule (25 mg total) by mouth every 6 (six) hours as needed for itching, allergies or sleep.  Marland Kitchen EPINEPHrine (EPI-PEN) 0.3 mg/0.3 mL SOAJ injection Inject 0.3 mLs (0.3 mg total) into the muscle once.  Marland Kitchen levothyroxine (SYNTHROID, LEVOTHROID) 137 MCG tablet Take 1 tablet  (137 mcg total) by mouth daily before breakfast.  . meloxicam (MOBIC) 15 MG tablet take 1/2 tablet by mouth twice a day  . Polyethyl Glycol-Propyl Glycol (SYSTANE OP) Apply 1 drop to eye 2 (two) times daily as needed. Dry eyes   . [DISCONTINUED] levothyroxine (SYNTHROID, LEVOTHROID) 137 MCG tablet Take 1 tablet (137 mcg total) by mouth daily before breakfast.   No facility-administered encounter medications on file as of 07/17/2017.    ALLERGIES: Allergies  Allergen Reactions  . Penicillins Swelling    VACCINATION STATUS: Immunization History  Administered Date(s) Administered  . Td 08/12/2002  . Tdap 12/03/2013    HPI  56 year old female patient with medical history as above. 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  - Review of her medical records show that she did receive I-131 therapy on 2 occasions (18.2 mCi on 09/19/2005 and 26.1 mCi on 04/04/2006 due to refractory Graves' disease). - She was started on thyroid hormone which was adjusted over the years to current level of 137 g by mouth every morning. She reports compliance to this medication.  She has no new complaints today.  She denies heat nor cold intolerance. She denies dysphagia, shortness of breath, voice change. She has significant family history of various thyroid dysfunctions. She denies  any family history of thyroid cancer. -She denies any recent major Change in her weight.  Review of Systems  Constitutional: no weight gain/loss, no fatigue, no subjective hyperthermia, no subjective hypothermia Eyes: no blurry vision, no xerophthalmia ENT: no sore throat, no nodules palpated in throat, no dysphagia/odynophagia, no hoarseness Cardiovascular: no Chest Pain, no Shortness of Breath, no palpitations, no leg swelling Respiratory: no cough, no SOB Gastrointestinal: no Nausea/Vomiting/Diarhhea Musculoskeletal: no muscle/joint aches Skin: no rashes Neurological: no tremors, no numbness, no tingling,  no dizziness Psychiatric: no depression, no anxiety  Objective:    BP 130/84   Pulse 79   Ht 5\' 2"  (1.575 m)   Wt 213 lb (96.6 kg)   LMP 10/23/2008   BMI 38.96 kg/m   Wt Readings from Last 3 Encounters:  07/17/17 213 lb (96.6 kg)  12/06/16 211 lb (95.7 kg)  11/29/16 214 lb (97.1 kg)    Physical Exam  Constitutional: Significantly over weight for hight, not in acute distress, normal state of mind Eyes: PERRLA, EOMI, no exophthalmos ENT: moist mucous membranes, no thyromegaly, no cervical lymphadenopathy Cardiovascular: normal precordial activity, Regular Rate and Rhythm, no Murmur/Rubs/Gallops Respiratory:  adequate breathing efforts, no gross chest deformity, Clear to auscultation bilaterally Gastrointestinal: abdomen soft, Non -tender, No distension, Bowel Sounds present Musculoskeletal: no gross deformities, strength intact in all four extremities Skin: moist, warm, no rashes Neurological: no tremor with outstretched hands, Deep tendon reflexes normal in all four extremities.  CMP ( most recent) CMP     Component Value Date/Time   NA 140 11/10/2016 0822   K 4.4 11/10/2016 0822   CL 105 11/10/2016 0822   CO2 24 11/10/2016 0822   GLUCOSE 99 11/10/2016 0822   BUN 16 11/10/2016 0822   CREATININE 0.78 11/10/2016 0822   CALCIUM 9.7 11/10/2016 0822   PROT 7.4 11/10/2016 0822   ALBUMIN 4.4 11/10/2016 0822   AST 25 11/10/2016 0822   ALT 17 11/10/2016 0822   ALKPHOS 65 11/10/2016 0822   BILITOT 0.5 11/10/2016 0822   GFRNONAA 85 11/10/2016 0822   GFRAA >89 11/10/2016 0822    Lipid Panel     Component Value Date/Time   CHOL 204 (H) 11/10/2016 0822   TRIG 51 11/10/2016 0822   HDL 101 11/10/2016 0822   CHOLHDL 2.0 11/10/2016 0822   VLDL 10 11/10/2016 0822   LDLCALC 93 11/10/2016 0822    Results for DNASIA, GAUNA (MRN [id number]) as of 07/17/2017 16:29  Ref. Range 11/29/2016 14:45 07/02/2017 14:46  TSH Latest Ref Range: 0.40 - 4.50 mIU/L 0.70 0.73  T4,Free(Direct)  Latest Ref Range: 0.8 - 1.8 ng/dL 1.5 1.5    Assessment & Plan:   1. Hypothyroidism following radioiodine therapy - She has RAI induced hypothyroidism. Based on her recent thyroid function test her current dose of Synthroid at 137 g by mouth every morning is the correct dose.    - We discussed about correct intake of levothyroxine, at fasting, with water, separated by at least 30 minutes from breakfast, and separated by more than 4 hours from calcium, iron, multivitamins, acid reflux medications (PPIs). -Patient is made aware of the fact that thyroid hormone replacement is needed for life, dose to be adjusted by periodic monitoring of thyroid function tests. - She does not have clinical goiter, no need for thyroid imaging at this time. 2. Vitamin D deficiency - I advised her to increase her vitamin D to 5000 units daily. She is 10 years postmenopausal with history of multiple joint  replacements due to osteoarthritis. I offered her screening bone density- which is pending.  - I advised patient to maintain close follow up with Orlena Sheldon, PA-C for primary care needs. Follow up plan: Return in about 1 year (around 07/17/2018) for follow up with pre-visit labs, follow up with Bone Density.  Glade Lloyd, MD Phone: [phone]  Fax: [phone]   07/17/2017, 4:28 PM

## 2017-09-18 HISTORY — PX: REVISION TOTAL HIP ARTHROPLASTY: SHX766

## 2017-11-12 ENCOUNTER — Encounter: Payer: Self-pay | Admitting: Physician Assistant

## 2018-02-23 ENCOUNTER — Other Ambulatory Visit: Payer: Self-pay | Admitting: Physician Assistant

## 2018-02-23 DIAGNOSIS — M25552 Pain in left hip: Secondary | ICD-10-CM

## 2018-02-23 DIAGNOSIS — M1612 Unilateral primary osteoarthritis, left hip: Secondary | ICD-10-CM

## 2018-03-01 ENCOUNTER — Other Ambulatory Visit: Payer: Self-pay

## 2018-03-01 MED ORDER — LEVOTHYROXINE SODIUM 137 MCG PO TABS: 137.0000 ug | ORAL_TABLET | Freq: Every day | ORAL | 1 refills | Status: DC

## 2018-03-08 ENCOUNTER — Telehealth: Payer: Self-pay | Admitting: Physician Assistant

## 2018-03-08 DIAGNOSIS — M25552 Pain in left hip: Secondary | ICD-10-CM

## 2018-03-08 DIAGNOSIS — M1612 Unilateral primary osteoarthritis, left hip: Secondary | ICD-10-CM

## 2018-03-08 MED ORDER — MELOXICAM 15 MG PO TABS: 7.5000 mg | ORAL_TABLET | Freq: Two times a day (BID) | ORAL | 0 refills | Status: DC

## 2018-03-08 NOTE — Telephone Encounter (Signed)
rx sent to pharmacy

## 2018-03-08 NOTE — Telephone Encounter (Signed)
Refill on mobic to [facility] [address] [facility] app for Monday.

## 2018-03-11 ENCOUNTER — Ambulatory Visit: Payer: BLUE CROSS/BLUE SHIELD | Admitting: Physician Assistant

## 2018-03-11 ENCOUNTER — Other Ambulatory Visit: Payer: Self-pay

## 2018-03-11 ENCOUNTER — Encounter: Payer: Self-pay | Admitting: Physician Assistant

## 2018-03-11 VITALS — BP 126/74 | HR 81 | Temp 98.2°F | Resp 14 | Ht 62.0 in | Wt 219.6 lb

## 2018-03-11 DIAGNOSIS — M150 Primary generalized (osteo)arthritis: Secondary | ICD-10-CM

## 2018-03-11 DIAGNOSIS — M159 Polyosteoarthritis, unspecified: Secondary | ICD-10-CM

## 2018-03-11 MED ORDER — MELOXICAM 7.5 MG PO TABS: 7.5000 mg | ORAL_TABLET | Freq: Every day | ORAL | 2 refills | Status: DC

## 2018-03-11 NOTE — Progress Notes (Signed)
Patient ID: Raven Mendoza MRN: [id number], DOB: 03-28-1961, 57 y.o. Date of Encounter: 03/11/2018, 4:29 PM    Chief Complaint:  Chief Complaint  Patient presents with  . Arthritis     HPI: 57 y.o. year old female presents for above.   States that she had contacted the pharmacy to get a refill on her Mobic and was told that she would need office visit. States that she just had her second hip replacement in April.  At this point has a "tune new knees and to new hips ".  That after this recent surgery she had some cellulitis so she is just recently been off of the antibiotics.  Says that after the surgery she was on muscle relaxers and pain medicines.  She is off those medicines is feeling like she needs some meloxicam at times.  Says that even her hands and feet have achy discomfort at times.  Has no other concerns to address today.     Home Meds:   Outpatient Medications Prior to Visit  Medication Sig Dispense Refill  . AMINO ACIDS COMPLEX PO Take 1 tablet by mouth daily.    . Cholecalciferol (VITAMIN D3) 5000 units CAPS Take 1 capsule (5,000 Units total) by mouth daily. 90 capsule 0  . EPINEPHrine (EPI-PEN) 0.3 mg/0.3 mL SOAJ injection Inject 0.3 mLs (0.3 mg total) into the muscle once. 2 Device 1  . levothyroxine (SYNTHROID, LEVOTHROID) 137 MCG tablet Take 1 tablet (137 mcg total) by mouth daily before breakfast. 90 tablet 1  . meloxicam (MOBIC) 15 MG tablet Take 0.5 tablets (7.5 mg total) by mouth 2 (two) times daily. 30 tablet 0  . Polyethyl Glycol-Propyl Glycol (SYSTANE OP) Apply 1 drop to eye 2 (two) times daily as needed. Dry eyes     . cycloSPORINE (RESTASIS) 0.05 % ophthalmic emulsion Place 1 drop into both eyes 2 (two) times daily.    . diphenhydrAMINE (BENADRYL) 25 mg capsule Take 1 capsule (25 mg total) by mouth every 6 (six) hours as needed for itching, allergies or sleep. 30 capsule    No facility-administered medications prior to visit.     Allergies:    Allergies  Allergen Reactions  . Penicillins Swelling      Review of Systems: See HPI for pertinent ROS. All other ROS negative.    Physical Exam: Blood pressure 126/74, pulse 81, temperature 98.2 F (36.8 C), temperature source Oral, resp. rate 14, height 5' 2" (1.575 m), weight 99.6 kg (219 lb 9.6 oz), last menstrual period 10/23/2008, SpO2 94 %., Body mass index is 40.17 kg/m. General:  Obese WF. Appears in no acute distress. Neck: Supple. No thyromegaly. No lymphadenopathy. Lungs: Clear bilaterally to auscultation without wheezes, rales, or rhonchi. Breathing is unlabored. Heart: Regular rhythm. No murmurs, rubs, or gallops. Msk:  Strength and tone normal for age. Extremities/Skin: Warm and dry.  Neuro: Alert and oriented X 3. Moves all extremities spontaneously. Gait is normal. CNII-XII grossly in tact. Psych:  Responds to questions appropriately with a normal affect.     ASSESSMENT AND PLAN:  57 y.o. year old female with   1. Primary osteoarthritis involving multiple joints Sent in refill on Mobic 7.5 mg.  Can take 1 daily as needed.  Reviewed that she had a be met 10/2016 which was normal.  I reviewed that she has history of thyroid disease.  This is managed by Dr. Nida/endocrinology. She has no other concerns to address today.    Signed, Karis Juba,  PA, [facility] 03/11/2018 4:29 PM

## 2018-03-12 ENCOUNTER — Other Ambulatory Visit: Payer: Self-pay | Admitting: Physician Assistant

## 2018-03-12 DIAGNOSIS — Z1231 Encounter for screening mammogram for malignant neoplasm of breast: Secondary | ICD-10-CM

## 2018-04-02 ENCOUNTER — Ambulatory Visit: Payer: BLUE CROSS/BLUE SHIELD

## 2018-04-06 ENCOUNTER — Other Ambulatory Visit: Payer: Self-pay | Admitting: Physician Assistant

## 2018-04-06 DIAGNOSIS — M25552 Pain in left hip: Secondary | ICD-10-CM

## 2018-04-06 DIAGNOSIS — M1612 Unilateral primary osteoarthritis, left hip: Secondary | ICD-10-CM

## 2018-05-22 ENCOUNTER — Other Ambulatory Visit: Payer: Self-pay | Admitting: Physician Assistant

## 2018-05-22 DIAGNOSIS — M1612 Unilateral primary osteoarthritis, left hip: Secondary | ICD-10-CM

## 2018-05-22 DIAGNOSIS — M25552 Pain in left hip: Secondary | ICD-10-CM

## 2018-06-26 ENCOUNTER — Other Ambulatory Visit: Payer: Self-pay | Admitting: Physician Assistant

## 2018-06-26 DIAGNOSIS — M1612 Unilateral primary osteoarthritis, left hip: Secondary | ICD-10-CM

## 2018-06-26 DIAGNOSIS — M25552 Pain in left hip: Secondary | ICD-10-CM

## 2018-07-17 ENCOUNTER — Ambulatory Visit: Payer: Self-pay | Admitting: "Endocrinology

## 2018-07-24 ENCOUNTER — Other Ambulatory Visit: Payer: Self-pay | Admitting: Physician Assistant

## 2018-07-24 DIAGNOSIS — M25552 Pain in left hip: Secondary | ICD-10-CM

## 2018-07-24 DIAGNOSIS — M1612 Unilateral primary osteoarthritis, left hip: Secondary | ICD-10-CM

## 2018-08-13 ENCOUNTER — Ambulatory Visit: Payer: Self-pay | Admitting: "Endocrinology

## 2018-09-03 ENCOUNTER — Other Ambulatory Visit: Payer: Self-pay | Admitting: "Endocrinology

## 2018-09-16 ENCOUNTER — Telehealth: Payer: Self-pay

## 2018-09-16 ENCOUNTER — Other Ambulatory Visit: Payer: Self-pay | Admitting: Family Medicine

## 2018-09-16 DIAGNOSIS — M1612 Unilateral primary osteoarthritis, left hip: Secondary | ICD-10-CM

## 2018-09-16 DIAGNOSIS — E890 Postprocedural hypothyroidism: Secondary | ICD-10-CM

## 2018-09-16 DIAGNOSIS — M25552 Pain in left hip: Secondary | ICD-10-CM

## 2018-09-16 MED ORDER — LEVOTHYROXINE SODIUM 137 MCG PO TABS: 137.0000 ug | ORAL_TABLET | Freq: Every day | ORAL | 1 refills | Status: DC

## 2018-09-16 NOTE — Telephone Encounter (Signed)
OPENED IN ERROR

## 2018-09-30 ENCOUNTER — Other Ambulatory Visit: Payer: Self-pay | Admitting: "Endocrinology

## 2018-09-30 DIAGNOSIS — E890 Postprocedural hypothyroidism: Secondary | ICD-10-CM

## 2018-10-01 LAB — TSH: TSH: 0.41 mIU/L (ref 0.40–4.50)

## 2018-10-01 LAB — T4, FREE: Free T4: 1.4 ng/dL (ref 0.8–1.8)

## 2018-10-07 ENCOUNTER — Ambulatory Visit (INDEPENDENT_AMBULATORY_CARE_PROVIDER_SITE_OTHER): Payer: BLUE CROSS/BLUE SHIELD | Admitting: "Endocrinology

## 2018-10-07 ENCOUNTER — Encounter: Payer: Self-pay | Admitting: "Endocrinology

## 2018-10-07 VITALS — BP 135/83 | HR 74 | Ht 62.0 in | Wt 208.0 lb

## 2018-10-07 DIAGNOSIS — E559 Vitamin D deficiency, unspecified: Secondary | ICD-10-CM | POA: Diagnosis not present

## 2018-10-07 DIAGNOSIS — E890 Postprocedural hypothyroidism: Secondary | ICD-10-CM

## 2018-10-07 MED ORDER — LEVOTHYROXINE SODIUM 137 MCG PO TABS: 137.0000 ug | ORAL_TABLET | Freq: Every day | ORAL | 3 refills | Status: DC

## 2018-10-07 NOTE — Progress Notes (Signed)
Endocrinology follow-up note   Subjective:    Patient ID: Raven Mendoza, female    DOB: 1960/10/09, PCP Orlena Sheldon, PA-C   Past Medical History:  Diagnosis Date  . Allergy to bee sting 04/20/2008   Anaphylaxis  . Arthritis   . Hyperthyroidism   . Hypothyroidism    Past Surgical History:  Procedure Laterality Date  . c sections  1987 and 1990  . KNEE CLOSED REDUCTION  03/26/2012   Procedure: CLOSED MANIPULATION KNEE;  Surgeon: Mauri Pole, MD;  Location: [facility];  Service: Orthopedics;  Laterality: Left;  . radioactive iodine tx for thyroid  4 yrs ago,and  5 yrs ago  . TOTAL HIP ARTHROPLASTY  10/24/2011   right  . TOTAL KNEE ARTHROPLASTY  01/16/2012   left  . TOTAL KNEE ARTHROPLASTY  03/26/2012   Procedure: TOTAL KNEE ARTHROPLASTY;  Surgeon: Mauri Pole, MD;  Location: [facility];  Service: Orthopedics;  Laterality: Right;   Social History   Socioeconomic History  . Marital status: Married    Spouse name: Not on file  . Number of children: Not on file  . Years of education: Not on file  . Highest education level: Not on file  Occupational History  . Not on file  Social Needs  . Financial resource strain: Not on file  . Food insecurity:    Worry: Not on file    Inability: Not on file  . Transportation needs:    Medical: Not on file    Non-medical: Not on file  Tobacco Use  . Smoking status: Never Smoker  . Smokeless tobacco: Never Used  Substance and Sexual Activity  . Alcohol use: No  . Drug use: No  . Sexual activity: Not on file  Lifestyle  . Physical activity:    Days per week: Not on file    Minutes per session: Not on file  . Stress: Not on file  Relationships  . Social connections:    Talks on phone: Not on file    Gets together: Not on file    Attends religious service: Not on file    Active member of club or organization: Not on file    Attends meetings of clubs or organizations: Not on file    Relationship status: Not on file  Other Topics  Concern  . 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    She is active and involved with the farm.   Outpatient Encounter Medications as of 10/07/2018  Medication Sig  . AMINO ACIDS COMPLEX PO Take 1 tablet by mouth daily.  . Cholecalciferol (VITAMIN D3) 5000 units CAPS Take 1 capsule (5,000 Units total) by mouth daily.  . diphenhydrAMINE (BENADRYL) 25 mg capsule Take 1 capsule (25 mg total) by mouth every 6 (six) hours as needed for itching, allergies or sleep.  Marland Kitchen EPINEPHrine (EPI-PEN) 0.3 mg/0.3 mL SOAJ injection Inject 0.3 mLs (0.3 mg total) into the muscle once.  Marland Kitchen levothyroxine (SYNTHROID, LEVOTHROID) 137 MCG tablet Take 1 tablet (137 mcg total) by mouth daily before breakfast.  . meloxicam (MOBIC) 15 MG tablet TAKE 1/2 TABLET BY MOUTH TWICE DAILY  . Polyethyl Glycol-Propyl Glycol (SYSTANE OP) Apply 1 drop to eye 2 (two) times daily as needed. Dry eyes   . [DISCONTINUED] levothyroxine (SYNTHROID, LEVOTHROID) 137 MCG tablet Take 1 tablet (137 mcg total) by mouth daily before breakfast.  . [DISCONTINUED] meloxicam (MOBIC) 7.5 MG tablet Take 1 tablet (  7.5 mg total) by mouth daily.   No facility-administered encounter medications on file as of 10/07/2018.    ALLERGIES: Allergies  Allergen Reactions  . Penicillins Swelling    VACCINATION STATUS: Immunization History  Administered Date(s) Administered  . Td 08/12/2002  . Tdap 12/03/2013    HPI  58 year old female patient with medical history as above. 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  - Review of her medical records show that she did receive I-131 therapy on 2 occasions (18.2 mCi on 09/19/2005 and 26.1 mCi on 04/04/2006 due to refractory Graves' disease). - She was started on thyroid hormone which was adjusted over the years to current level of 137 g by mouth every morning. She reports compliance to this medication.  She has no new complaints today.  She denies heat nor cold  intolerance. She denies dysphagia, shortness of breath, voice change. She has significant family history of various thyroid dysfunctions. She denies any family history of thyroid cancer. -She denies any recent major Change in her weight.  Review of Systems  Constitutional: no weight gain/loss, no fatigue, no subjective hyperthermia, no subjective hypothermia Eyes: no blurry vision, no xerophthalmia ENT: no sore throat, no nodules palpated in throat, no dysphagia/odynophagia, no hoarseness Musculoskeletal: no muscle/joint aches Skin: no rashes Neurological: no tremors, no numbness, no tingling, no dizziness Psychiatric: no depression, no anxiety  Objective:    BP 135/83   Pulse 74   Ht 5\' 2"  (1.575 m)   Wt 208 lb (94.3 kg)   LMP 10/23/2008   BMI 38.04 kg/m   Wt Readings from Last 3 Encounters:  10/07/18 208 lb (94.3 kg)  03/11/18 219 lb 9.6 oz (99.6 kg)  07/17/17 213 lb (96.6 kg)    Physical Exam  Constitutional: Significantly over weight for hight, not in acute distress, normal state of mind Eyes: PERRLA, EOMI, no exophthalmos ENT: moist mucous membranes, no thyromegaly, no cervical lymphadenopathy  Musculoskeletal: no gross deformities, strength intact in all four extremities Skin: moist, warm, no rashes Neurological: no tremor with outstretched hands, Deep tendon reflexes normal in all four extremities.  CMP ( most recent) CMP     Component Value Date/Time   NA 140 11/10/2016 0822   K 4.4 11/10/2016 0822   CL 105 11/10/2016 0822   CO2 24 11/10/2016 0822   GLUCOSE 99 11/10/2016 0822   BUN 16 11/10/2016 0822   CREATININE 0.78 11/10/2016 0822   CALCIUM 9.7 11/10/2016 0822   PROT 7.4 11/10/2016 0822   ALBUMIN 4.4 11/10/2016 0822   AST 25 11/10/2016 0822   ALT 17 11/10/2016 0822   ALKPHOS 65 11/10/2016 0822   BILITOT 0.5 11/10/2016 0822   GFRNONAA 85 11/10/2016 0822   GFRAA >89 11/10/2016 0822    Lipid Panel     Component Value Date/Time   CHOL 204 (H)  11/10/2016 0822   TRIG 51 11/10/2016 0822   HDL 101 11/10/2016 0822   CHOLHDL 2.0 11/10/2016 0822   VLDL 10 11/10/2016 0822   LDLCALC 93 11/10/2016 0822   Recent Results (from the past 2160 hour(s))  TSH     Status: None   Collection Time: 09/30/18 11:11 AM  Result Value Ref Range   TSH 0.41 0.40 - 4.50 mIU/L  T4, Free     Status: None   Collection Time: 09/30/18 11:11 AM  Result Value Ref Range   Free T4 1.4 0.8 - 1.8 ng/dL     Assessment & Plan:   1.  Hypothyroidism following radioiodine therapy - She has RAI induced hypothyroidism.  Her previsit thyroid function tests are consistent with appropriate replacement.  She is advised to continue levothyroxine 137 mcg p.o. every morning before breakfast.     - We discussed about correct intake of levothyroxine, at fasting, with water, separated by at least 30 minutes from breakfast, and separated by more than 4 hours from calcium, iron, multivitamins, acid reflux medications (PPIs). -Patient is made aware of the fact that thyroid hormone replacement is needed for life, dose to be adjusted by periodic monitoring of thyroid function tests. - She does not have clinical goiter, no need for thyroid imaging at this time. 2. Vitamin D deficiency - I advised her to increase her vitamin D to 5000 units daily. She is 10 years postmenopausal with history of multiple joint replacements due to osteoarthritis. I offered her screening bone density- which is pending.  - I advised patient to maintain close follow up with Orlena Sheldon, PA-C for primary care needs. Follow up plan: Return in about 1 year (around 10/08/2019) for Follow up with Pre-visit Labs.  Glade Lloyd, MD Phone: [phone]  Fax: [phone]   10/07/2018, 4:29 PM

## 2019-01-04 ENCOUNTER — Other Ambulatory Visit: Payer: Self-pay | Admitting: Family Medicine

## 2019-01-04 DIAGNOSIS — M25552 Pain in left hip: Secondary | ICD-10-CM

## 2019-01-04 DIAGNOSIS — M1612 Unilateral primary osteoarthritis, left hip: Secondary | ICD-10-CM

## 2019-05-04 ENCOUNTER — Other Ambulatory Visit: Payer: Self-pay | Admitting: Family Medicine

## 2019-05-04 DIAGNOSIS — M1612 Unilateral primary osteoarthritis, left hip: Secondary | ICD-10-CM

## 2019-05-04 DIAGNOSIS — M25552 Pain in left hip: Secondary | ICD-10-CM

## 2019-06-01 ENCOUNTER — Other Ambulatory Visit: Payer: Self-pay | Admitting: Family Medicine

## 2019-06-01 DIAGNOSIS — M1612 Unilateral primary osteoarthritis, left hip: Secondary | ICD-10-CM

## 2019-06-01 DIAGNOSIS — M25552 Pain in left hip: Secondary | ICD-10-CM

## 2019-08-01 ENCOUNTER — Encounter: Payer: Self-pay | Admitting: Family Medicine

## 2019-08-01 ENCOUNTER — Other Ambulatory Visit: Payer: Self-pay

## 2019-08-01 ENCOUNTER — Telehealth: Payer: Self-pay | Admitting: *Deleted

## 2019-08-01 ENCOUNTER — Ambulatory Visit (INDEPENDENT_AMBULATORY_CARE_PROVIDER_SITE_OTHER): Payer: BLUE CROSS/BLUE SHIELD | Admitting: Family Medicine

## 2019-08-01 VITALS — BP 136/82 | HR 98 | Temp 97.9°F | Resp 14 | Ht 62.0 in | Wt 211.0 lb

## 2019-08-01 DIAGNOSIS — M179 Osteoarthritis of knee, unspecified: Secondary | ICD-10-CM | POA: Insufficient documentation

## 2019-08-01 DIAGNOSIS — Z23 Encounter for immunization: Secondary | ICD-10-CM

## 2019-08-01 DIAGNOSIS — Z79899 Other long term (current) drug therapy: Secondary | ICD-10-CM

## 2019-08-01 DIAGNOSIS — E890 Postprocedural hypothyroidism: Secondary | ICD-10-CM

## 2019-08-01 DIAGNOSIS — Z96643 Presence of artificial hip joint, bilateral: Secondary | ICD-10-CM

## 2019-08-01 DIAGNOSIS — M1710 Unilateral primary osteoarthritis, unspecified knee: Secondary | ICD-10-CM | POA: Insufficient documentation

## 2019-08-01 DIAGNOSIS — M170 Bilateral primary osteoarthritis of knee: Secondary | ICD-10-CM

## 2019-08-01 DIAGNOSIS — M1612 Unilateral primary osteoarthritis, left hip: Secondary | ICD-10-CM

## 2019-08-01 MED ORDER — MELOXICAM 15 MG PO TABS: 15.0000 mg | ORAL_TABLET | Freq: Every day | ORAL | 2 refills | Status: DC

## 2019-08-01 MED ORDER — SHINGRIX 50 MCG/0.5ML IM SUSR: 0.5000 mL | Freq: Once | INTRAMUSCULAR | 1 refills | Status: AC

## 2019-08-01 NOTE — Telephone Encounter (Signed)
-----   Message from Alycia Rossetti, MD sent at 08/01/2019  2:33 PM EST ----- Regarding: send shingrix to pharmacy

## 2019-08-01 NOTE — Patient Instructions (Addendum)
F/U March or April for CPE We will call with lab results Shingles vaccine sent to pharmacy

## 2019-08-01 NOTE — Assessment & Plan Note (Signed)
Status post hip and knee replacements bilaterally.  I refilled her meloxicam she has not had a metabolic panel in 2 years I will obtain this today.  When she comes in for her complete physical we will do lipid panel set up for colonoscopy mammogram

## 2019-08-01 NOTE — Assessment & Plan Note (Signed)
Followed by endocrinology.  She has been stable on thyroid replacement.

## 2019-08-01 NOTE — Progress Notes (Signed)
Subjective:    Patient ID: Raven Mendoza, female    DOB: 05/29/1961, 58 y.o.   MRN: [id number]  Patient presents for Medication Review/ Refill (is not fasting)   Pt here to f/u chronic medical problems, this is my first time meeting her,previous patient of PA   Medications and history reviewed   Dr. Ann Maki orthopedics - she has had bilateral hip and knee replacements, she does get some mild swelling during the day, but ankles normal when elevated and first thing in AM   Takes meloxicam daily, she ran out 3 weeks ago and now feels the aching and pain  also takes tylenol  Dr.  Dorris Fetch follows her thyroid, now hypothyroidism since she had radiative ablation , seen in Jan 2020, follows yearly    Eye doctor- has glasses - Dr. Herbert Deaner , she has small cataracts, uses systane drops as needed    She had bee sting in face in the past had swelling, given epi pen then, no futher swelling of face or throat with stings since then, no longer carries   Due for mammogram, plans to get at the first of the year due to [place]    Interested in Shingrix vaccine ,Due for CPE will defer untl the spring      No concerns    Due for flu shot     Review Of Systems:  GEN- denies fatigue, fever, weight loss,weakness, recent illness HEENT- denies eye drainage, change in vision, nasal discharge, CVS- denies chest pain, palpitations RESP- denies SOB, cough, wheeze ABD- denies N/V, change in stools, abd pain GU- denies dysuria, hematuria, dribbling, incontinence MSK- + joint pain, muscle aches, injury Neuro- denies headache, dizziness, syncope, seizure activity       Objective:    BP 136/82   Pulse 98   Temp 97.9 F (36.6 C) (Temporal)   Resp 14   Ht 5\' 2"  (1.575 m)   Wt 211 lb (95.7 kg)   LMP 10/23/2008   SpO2 96%   BMI 38.59 kg/m  GEN- NAD, alert and oriented x3,obese  HEENT- PERRL, EOMI, non injected sclera, pink conjunctiva, MMM, oropharynx clear Neck- Supple, no thyromegaly CVS-  RRR, no murmur RESP-CTAB ABD-NABS,soft,NT,ND EXT- trace ankle  edema Pulses- Radial, DP- 2+        Assessment & Plan:      Problem List Items Addressed This Visit      Unprioritized   Hypothyroidism following radioiodine therapy    Followed by endocrinology.  She has been stable on thyroid replacement.      OA (osteoarthritis) of knee    Status post hip and knee replacements bilaterally.  I refilled her meloxicam she has not had a metabolic panel in 2 years I will obtain this today.  When she comes in for her complete physical we will do lipid panel set up for colonoscopy mammogram      Relevant Medications   meloxicam (MOBIC) 15 MG tablet   S/P hip replacement, bilateral    Other Visit Diagnoses    Long-term use of high-risk medication    -  Primary   She has not had any GI upset with regards to the chronic NSAID.  Will check renal function per above   Relevant Orders   Basic metabolic panel   Need for immunization against influenza       Relevant Orders   Flu Vaccine QUAD 36+ mos IM (Completed)   Osteoarthritis of left hip, unspecified osteoarthritis type  Relevant Medications   meloxicam (MOBIC) 15 MG tablet      Note: This dictation was prepared with Dragon dictation along with smaller phrase technology. Any transcriptional errors that result from this process are unintentional.

## 2019-08-02 LAB — BASIC METABOLIC PANEL
BUN: 23 mg/dL (ref 7–25)
CO2: 23 mmol/L (ref 20–32)
Calcium: 9.6 mg/dL (ref 8.6–10.4)
Chloride: 105 mmol/L (ref 98–110)
Creat: 0.67 mg/dL (ref 0.50–0.99)
Glucose, Bld: 121 mg/dL — ABNORMAL HIGH (ref 65–99)
Potassium: 3.8 mmol/L (ref 3.5–5.3)
Sodium: 140 mmol/L (ref 135–146)

## 2019-10-07 ENCOUNTER — Telehealth: Payer: Self-pay

## 2019-10-07 DIAGNOSIS — E890 Postprocedural hypothyroidism: Secondary | ICD-10-CM

## 2019-10-07 DIAGNOSIS — E559 Vitamin D deficiency, unspecified: Secondary | ICD-10-CM

## 2019-10-07 NOTE — Telephone Encounter (Signed)
New order sent.

## 2019-10-07 NOTE — Telephone Encounter (Signed)
Pt is calling to cxl appt for 1-20 and to get labs reordered , she will call back for a phone visit once her labs are completed

## 2019-10-08 ENCOUNTER — Ambulatory Visit: Payer: BLUE CROSS/BLUE SHIELD | Admitting: "Endocrinology

## 2019-11-11 ENCOUNTER — Encounter: Payer: Self-pay | Admitting: Family Medicine

## 2019-12-10 LAB — T4, FREE: Free T4: 1.3 ng/dL (ref 0.8–1.8)

## 2019-12-10 LAB — VITAMIN D 25 HYDROXY (VIT D DEFICIENCY, FRACTURES): Vit D, 25-Hydroxy: 31 ng/mL (ref 30–100)

## 2019-12-10 LAB — TSH: TSH: 0.08 mIU/L — ABNORMAL LOW (ref 0.40–4.50)

## 2019-12-13 ENCOUNTER — Other Ambulatory Visit: Payer: Self-pay | Admitting: "Endocrinology

## 2019-12-13 DIAGNOSIS — E890 Postprocedural hypothyroidism: Secondary | ICD-10-CM

## 2019-12-22 ENCOUNTER — Other Ambulatory Visit: Payer: Self-pay | Admitting: "Endocrinology

## 2019-12-22 ENCOUNTER — Telehealth: Payer: Self-pay | Admitting: "Endocrinology

## 2019-12-22 DIAGNOSIS — E890 Postprocedural hypothyroidism: Secondary | ICD-10-CM

## 2019-12-22 MED ORDER — LEVOTHYROXINE SODIUM 137 MCG PO TABS: 137.0000 ug | ORAL_TABLET | Freq: Every day | ORAL | 0 refills | Status: DC

## 2019-12-22 NOTE — Telephone Encounter (Signed)
Pt did her labs and will come to see you in 2 weeks. Can she have a refill on her thyroid medication until appt? [facility] [address]

## 2019-12-22 NOTE — Telephone Encounter (Signed)
Yes, I will send in a rx.

## 2020-01-06 ENCOUNTER — Encounter: Payer: Self-pay | Admitting: "Endocrinology

## 2020-01-06 ENCOUNTER — Ambulatory Visit (INDEPENDENT_AMBULATORY_CARE_PROVIDER_SITE_OTHER): Payer: Self-pay | Admitting: "Endocrinology

## 2020-01-06 ENCOUNTER — Other Ambulatory Visit: Payer: Self-pay

## 2020-01-06 VITALS — BP 132/85 | HR 76 | Ht 62.0 in | Wt 205.4 lb

## 2020-01-06 DIAGNOSIS — E559 Vitamin D deficiency, unspecified: Secondary | ICD-10-CM

## 2020-01-06 DIAGNOSIS — E890 Postprocedural hypothyroidism: Secondary | ICD-10-CM

## 2020-01-06 MED ORDER — LEVOTHYROXINE SODIUM 125 MCG PO TABS: 125.0000 ug | ORAL_TABLET | Freq: Every day | ORAL | 1 refills | Status: DC

## 2020-01-06 MED ORDER — VITAMIN D3 125 MCG (5000 UT) PO CAPS: 5000.0000 [IU] | ORAL_CAPSULE | Freq: Every day | ORAL | 1 refills | Status: DC

## 2020-01-06 NOTE — Progress Notes (Signed)
01/06/2020  Endocrinology follow-up note   Subjective:    Patient ID: Raven Mendoza, female    DOB: September 05, 1961, PCP Alycia Rossetti, MD   Past Medical History:  Diagnosis Date  . Allergy to bee sting 04/20/2008   Anaphylaxis  . Arthritis   . Hyperthyroidism   . Hypothyroidism    Past Surgical History:  Procedure Laterality Date  . c sections  1987 and 1990  . KNEE CLOSED REDUCTION  03/26/2012   Procedure: CLOSED MANIPULATION KNEE;  Surgeon: Mauri Pole, MD;  Location: [facility];  Service: Orthopedics;  Laterality: Left;  . radioactive iodine tx for thyroid  4 yrs ago,and  5 yrs ago  . REVISION TOTAL HIP ARTHROPLASTY Left 2019  . TOTAL HIP ARTHROPLASTY  10/24/2011   right  . TOTAL KNEE ARTHROPLASTY  01/16/2012   left  . TOTAL KNEE ARTHROPLASTY  03/26/2012   Procedure: TOTAL KNEE ARTHROPLASTY;  Surgeon: Mauri Pole, MD;  Location: [facility];  Service: Orthopedics;  Laterality: Right;   Social History   Socioeconomic History  . Marital status: Married    Spouse name: Not on file  . Number of children: Not on file  . Years of education: Not on file  . Highest education level: Not on file  Occupational History  . Not on file  Tobacco Use  . Smoking status: Never Smoker  . Smokeless tobacco: Never Used  Substance and Sexual Activity  . Alcohol use: No  . Drug use: No  . Sexual activity: Not on file  Other Topics Concern  . 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    She is active and involved with the farm.   Social Determinants of Health   Financial Resource Strain:   . Difficulty of Paying Living Expenses:   Food Insecurity:   . Worried About Charity fundraiser in the Last Year:   . Arboriculturist in the Last Year:   Transportation Needs:   . Film/video editor (Medical):   Marland Kitchen Lack of Transportation (Non-Medical):   Physical Activity:   . Days of Exercise per Week:   . Minutes of Exercise per Session:   Stress:   .  Feeling of Stress :   Social Connections:   . Frequency of Communication with Friends and Family:   . Frequency of Social Gatherings with Friends and Family:   . Attends Religious Services:   . Active Member of Clubs or Organizations:   . Attends Archivist Meetings:   Marland Kitchen Marital Status:    Outpatient Encounter Medications as of 01/06/2020  Medication Sig  . AMINO ACIDS COMPLEX PO Take 1 tablet by mouth daily.  . Cholecalciferol (VITAMIN D3) 125 MCG (5000 UT) CAPS Take 1 capsule (5,000 Units total) by mouth daily.  Marland Kitchen levothyroxine (SYNTHROID) 125 MCG tablet Take 1 tablet (125 mcg total) by mouth daily before breakfast.  . meloxicam (MOBIC) 15 MG tablet Take 1 tablet (15 mg total) by mouth daily.  Vladimir Faster Glycol-Propyl Glycol (SYSTANE OP) Apply 1 drop to eye 2 (two) times daily as needed. Dry eyes   . [DISCONTINUED] Cholecalciferol (VITAMIN D3) 5000 units CAPS Take 1 capsule (5,000 Units total) by mouth daily.  . [DISCONTINUED] levothyroxine (SYNTHROID) 137 MCG tablet Take 1 tablet (137 mcg total) by mouth daily before breakfast.   No facility-administered encounter medications on file as of 01/06/2020.   ALLERGIES: Allergies  Allergen Reactions  .  Penicillins Swelling    VACCINATION STATUS: Immunization History  Administered Date(s) Administered  . Influenza,inj,Quad PF,6+ Mos 08/01/2019  . Td 08/12/2002  . Tdap 12/03/2013    HPI  59 year old female patient with medical history as above. 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  - Review of her medical records show that she did receive I-131 therapy on 2 occasions (18.2 mCi on 09/19/2005 and 26.1 mCi on 04/04/2006 due to refractory Graves' disease). - She was started on thyroid hormone replacements which was adjusted several times over the years.  She is currently on levothyroxine 137 mcg p.o. daily before breakfast.    She reports compliance to this medication.  She has no new complaints today.   She denies heat nor cold intolerance. She denies dysphagia, shortness of breath, voice change. She has significant family history of various thyroid dysfunctions. She denies any family history of thyroid cancer. -She has lost 10+ pounds since June 2020.   -She also underwent left knee replacement since last visit.  Review of Systems Limited as above.  Objective:    BP 132/85   Pulse 76   Ht 5\' 2"  (1.575 m)   Wt 205 lb 6.4 oz (93.2 kg)   LMP 10/23/2008   BMI 37.57 kg/m   Wt Readings from Last 3 Encounters:  01/06/20 205 lb 6.4 oz (93.2 kg)  08/01/19 211 lb (95.7 kg)  10/07/18 208 lb (94.3 kg)    Physical Exam   constitutional:  Body mass index is 37.57 kg/m. , not in acute distress, normal state of mind Eyes:  EOMI, no exophthalmos Neck: Supple Thyroid: No gross goiter Respiratory: Adequate breathing efforts Musculoskeletal: no gross deformities, strength intact in all four extremities, no gross restriction of joint movements Skin:  no rashes, no hyperemia Neurological: no tremor with outstretched hands,   CMP ( most recent) CMP     Component Value Date/Time   NA 140 08/01/2019 1431   K 3.8 08/01/2019 1431   CL 105 08/01/2019 1431   CO2 23 08/01/2019 1431   GLUCOSE 121 (H) 08/01/2019 1431   BUN 23 08/01/2019 1431   CREATININE 0.67 08/01/2019 1431   CALCIUM 9.6 08/01/2019 1431   PROT 7.4 11/10/2016 0822   ALBUMIN 4.4 11/10/2016 0822   AST 25 11/10/2016 0822   ALT 17 11/10/2016 0822   ALKPHOS 65 11/10/2016 0822   BILITOT 0.5 11/10/2016 0822   GFRNONAA 85 11/10/2016 0822   GFRAA >89 11/10/2016 0822    Lipid Panel     Component Value Date/Time   CHOL 204 (H) 11/10/2016 0822   TRIG 51 11/10/2016 0822   HDL 101 11/10/2016 0822   CHOLHDL 2.0 11/10/2016 0822   VLDL 10 11/10/2016 0822   LDLCALC 93 11/10/2016 0822   Recent Results (from the past 2160 hour(s))  T4, Free     Status: None   Collection Time: 12/09/19  4:38 PM  Result Value Ref Range   Free T4  1.3 0.8 - 1.8 ng/dL  VITAMIN D 25 Hydroxy (Vit-D Deficiency, Fractures)     Status: None   Collection Time: 12/09/19  4:38 PM  Result Value Ref Range   Vit D, 25-Hydroxy 31 30 - 100 ng/mL    Comment: Vitamin D Status         25-OH Vitamin D: . Deficiency:                    <20 ng/mL Insufficiency:  20 - 29 ng/mL Optimal:                 > or = 30 ng/mL . For 25-OH Vitamin D testing on patients on  D2-supplementation and patients for whom quantitation  of D2 and D3 fractions is required, the [facility](TM) 25-OH VIT D, (D2,D3), LC/MS/MS is recommended: order  code 463 [id number] 1297 (patients >97yrs). See Note 1 . Note 1 . For additional information, please refer to  http://education.[facility].com/faq/FAQ199  (This link is being provided for informational/ educational purposes only.)   TSH     Status: Abnormal   Collection Time: 12/09/19  4:38 PM  Result Value Ref Range   TSH 0.08 (L) 0.40 - 4.50 mIU/L     Assessment & Plan:   1. 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  Her previsit thyroid function tests are consistent with slight over replacement.  She is approached for a lower dose of Synthroid.  125 mcg of Synthroid was prescribed for her to take daily before breakfast.     - We discussed about the correct intake of her thyroid hormone, on empty stomach at fasting, with water, separated by at least 30 minutes from breakfast and other medications,  and separated by more than 4 hours from calcium, iron, multivitamins, acid reflux medications (PPIs). -Patient is made aware of the fact that thyroid hormone replacement is needed for life, dose to be adjusted by periodic monitoring of thyroid function tests.   - She does not have clinical goiter, no need for thyroid imaging at this time.   2. Vitamin D deficiency -Her vitamin D has corrected at 54.  She would  benefit from continued vitamin D supplements at 5000 units of vitamin D3 daily.     - I advised patient to maintain close follow up with Alycia Rossetti, MD for primary care needs.      - Time spent on this patient care encounter:  20 minutes of which 50% was spent in  counseling and the rest reviewing  her current and  previous labs / studies and medications  doses and developing a plan for long term care. Kayren Eaves Smejkal  participated in the discussions, expressed understanding, and voiced agreement with the above plans.  All questions were answered to her satisfaction. she is encouraged to contact clinic should she have any questions or concerns prior to her return visit.  Follow up plan: Return in about 6 months (around 07/07/2020) for Follow up with Pre-visit Labs.  Glade Lloyd, MD Phone: [phone]  Fax: [phone]   01/06/2020, 4:26 PM

## 2020-01-18 ENCOUNTER — Other Ambulatory Visit: Payer: Self-pay | Admitting: "Endocrinology

## 2020-01-18 DIAGNOSIS — E890 Postprocedural hypothyroidism: Secondary | ICD-10-CM

## 2020-04-19 ENCOUNTER — Other Ambulatory Visit: Payer: Self-pay | Admitting: Family Medicine

## 2020-05-18 ENCOUNTER — Other Ambulatory Visit: Payer: Self-pay | Admitting: Family Medicine

## 2020-06-17 ENCOUNTER — Other Ambulatory Visit: Payer: Self-pay | Admitting: Family Medicine

## 2020-07-11 ENCOUNTER — Other Ambulatory Visit: Payer: Self-pay | Admitting: "Endocrinology

## 2020-07-11 DIAGNOSIS — E890 Postprocedural hypothyroidism: Secondary | ICD-10-CM

## 2020-07-17 ENCOUNTER — Telehealth: Payer: Self-pay | Admitting: "Endocrinology

## 2020-07-17 DIAGNOSIS — E890 Postprocedural hypothyroidism: Secondary | ICD-10-CM

## 2020-07-22 NOTE — Telephone Encounter (Signed)
Pt sch a appt for 11/15 and would like to know if enough can be sent in to get her to her appt

## 2020-07-23 ENCOUNTER — Other Ambulatory Visit: Payer: Self-pay

## 2020-07-23 DIAGNOSIS — E890 Postprocedural hypothyroidism: Secondary | ICD-10-CM

## 2020-07-23 MED ORDER — LEVOTHYROXINE SODIUM 137 MCG PO TABS: ORAL_TABLET | ORAL | 0 refills | Status: DC

## 2020-07-23 NOTE — Telephone Encounter (Signed)
Refill 137 x 30 days. Thanks.

## 2020-07-23 NOTE — Telephone Encounter (Signed)
Please advise on which dose of Synthroid she should be taking, on 4/20 she wa sprescribed 137mcg at office visit, then on 5/4 she was prescribed 160mcg.

## 2020-07-23 NOTE — Telephone Encounter (Signed)
Sent in script, attempted to contact patient, no answer, no answering machine to leave message.

## 2020-07-27 LAB — TSH: TSH: 0.509 u[IU]/mL (ref 0.450–4.500)

## 2020-07-27 LAB — T4, FREE: Free T4: 1.36 ng/dL (ref 0.82–1.77)

## 2020-08-02 ENCOUNTER — Other Ambulatory Visit: Payer: Self-pay

## 2020-08-02 ENCOUNTER — Ambulatory Visit (INDEPENDENT_AMBULATORY_CARE_PROVIDER_SITE_OTHER): Payer: 59 | Admitting: "Endocrinology

## 2020-08-02 ENCOUNTER — Encounter: Payer: Self-pay | Admitting: "Endocrinology

## 2020-08-02 VITALS — BP 146/82 | HR 84 | Ht 62.0 in | Wt 208.4 lb

## 2020-08-02 DIAGNOSIS — E890 Postprocedural hypothyroidism: Secondary | ICD-10-CM

## 2020-08-02 DIAGNOSIS — E559 Vitamin D deficiency, unspecified: Secondary | ICD-10-CM

## 2020-08-02 MED ORDER — LEVOTHYROXINE SODIUM 125 MCG PO TABS: ORAL_TABLET | ORAL | 1 refills | Status: DC

## 2020-08-02 NOTE — Progress Notes (Signed)
08/02/2020  Endocrinology follow-up note   Subjective:    Patient ID: Raven Mendoza, female    DOB: [birth year], PCP Alycia Rossetti, MD   Past Medical History:  Diagnosis Date  . Allergy to bee sting 04/20/2008   Anaphylaxis  . Arthritis   . Hyperthyroidism   . Hypothyroidism    Past Surgical History:  Procedure Laterality Date  . c sections  1987 and 1990  . KNEE CLOSED REDUCTION  03/26/2012   Procedure: CLOSED MANIPULATION KNEE;  Surgeon: Mauri Pole, MD;  Location: [facility];  Service: Orthopedics;  Laterality: Left;  . radioactive iodine tx for thyroid  4 yrs ago,and  5 yrs ago  . REVISION TOTAL HIP ARTHROPLASTY Left 2019  . TOTAL HIP ARTHROPLASTY  10/24/2011   right  . TOTAL KNEE ARTHROPLASTY  01/16/2012   left  . TOTAL KNEE ARTHROPLASTY  03/26/2012   Procedure: TOTAL KNEE ARTHROPLASTY;  Surgeon: Mauri Pole, MD;  Location: [facility];  Service: Orthopedics;  Laterality: Right;   Social History   Socioeconomic History  . Marital status: Married    Spouse name: Not on file  . Number of children: Not on file  . Years of education: Not on file  . Highest education level: Not on file  Occupational History  . Not on file  Tobacco Use  . Smoking status: Never Smoker  . Smokeless tobacco: Never Used  Vaping Use  . Vaping Use: Never used  Substance and Sexual Activity  . Alcohol use: No  . Drug use: No  . Sexual activity: Not on file  Other Topics Concern  . 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    She is active and involved with the farm.   Social Determinants of Health   Financial Resource Strain:   . Difficulty of Paying Living Expenses: Not on file  Food Insecurity:   . Worried About Charity fundraiser in the Last Year: Not on file  . Ran Out of Food in the Last Year: Not on file  Transportation Needs:   . Lack of Transportation (Medical): Not on file  . Lack of Transportation (Non-Medical): Not on file  Physical  Activity:   . Days of Exercise per Week: Not on file  . Minutes of Exercise per Session: Not on file  Stress:   . Feeling of Stress : Not on file  Social Connections:   . Frequency of Communication with Friends and Family: Not on file  . Frequency of Social Gatherings with Friends and Family: Not on file  . Attends Religious Services: Not on file  . Active Member of Clubs or Organizations: Not on file  . Attends Archivist Meetings: Not on file  . Marital Status: Not on file   Outpatient Encounter Medications as of 08/02/2020  Medication Sig  . Cholecalciferol (VITAMIN D3) 125 MCG (5000 UT) CAPS Take 1 capsule (5,000 Units total) by mouth daily.  Marland Kitchen levothyroxine (SYNTHROID) 125 MCG tablet TAKE 1 TABLET(137 MCG) BY MOUTH DAILY BEFORE BREAKFAST  . meloxicam (MOBIC) 15 MG tablet TAKE 1 TABLET(15 MG) BY MOUTH DAILY  . [DISCONTINUED] AMINO ACIDS COMPLEX PO Take 1 tablet by mouth daily.  . [DISCONTINUED] levothyroxine (SYNTHROID) 137 MCG tablet TAKE 1 TABLET(137 MCG) BY MOUTH DAILY BEFORE BREAKFAST  . [DISCONTINUED] Polyethyl Glycol-Propyl Glycol (SYSTANE OP) Apply 1 drop to eye 2 (two) times daily as needed. Dry eyes    No facility-administered  encounter medications on file as of 08/02/2020.   ALLERGIES: Allergies  Allergen Reactions  . Penicillins Swelling    VACCINATION STATUS: Immunization History  Administered Date(s) Administered  . Influenza,inj,Quad PF,6+ Mos 08/01/2019  . Td 08/12/2002  . Tdap 12/03/2013    HPI  59 year old female patient with medical history as above. 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  - Review of her medical records show that she did receive I-131 therapy on 2 occasions (18.2 mCi on 09/19/2005 and 26.1 mCi on 04/04/2006 due to refractory Graves' disease). - She was started on thyroid hormone replacements which was adjusted several times over the years.  She is currently on levothyroxine 125 mcg p.o. daily before  breakfast.  Her previsit labs are consistent with appropriate replacement.     She reports compliance to this medication.  She has no new complaints today.  She denies heat nor cold intolerance. She denies dysphagia, shortness of breath, voice change. She has significant family history of various thyroid dysfunctions. She denies any family history of thyroid cancer. -She has lost 10+ pounds since June 2020.   -She also underwent left knee replacement since last visit.  Review of Systems Limited as above.  Objective:    BP (!) 146/82   Pulse 84   Ht 5\' 2"  (1.575 m)   Wt 208 lb 6.4 oz (94.5 kg)   LMP 10/23/2008   BMI 38.12 kg/m   Wt Readings from Last 3 Encounters:  08/02/20 208 lb 6.4 oz (94.5 kg)  01/06/20 205 lb 6.4 oz (93.2 kg)  08/01/19 211 lb (95.7 kg)    Physical Exam    CMP ( most recent) CMP     Component Value Date/Time   NA 140 08/01/2019 1431   K 3.8 08/01/2019 1431   CL 105 08/01/2019 1431   CO2 23 08/01/2019 1431   GLUCOSE 121 (H) 08/01/2019 1431   BUN 23 08/01/2019 1431   CREATININE 0.67 08/01/2019 1431   CALCIUM 9.6 08/01/2019 1431   PROT 7.4 11/10/2016 0822   ALBUMIN 4.4 11/10/2016 0822   AST 25 11/10/2016 0822   ALT 17 11/10/2016 0822   ALKPHOS 65 11/10/2016 0822   BILITOT 0.5 11/10/2016 0822   GFRNONAA 85 11/10/2016 0822   GFRAA >89 11/10/2016 0822    Lipid Panel     Component Value Date/Time   CHOL 204 (H) 11/10/2016 0822   TRIG 51 11/10/2016 0822   HDL 101 11/10/2016 0822   CHOLHDL 2.0 11/10/2016 0822   VLDL 10 11/10/2016 0822   LDLCALC 93 11/10/2016 0822   Recent Results (from the past 2160 hour(s))  T4, Free     Status: None   Collection Time: 07/26/20 12:26 PM  Result Value Ref Range   Free T4 1.36 0.82 - 1.77 ng/dL  TSH     Status: None   Collection Time: 07/26/20 12:26 PM  Result Value Ref Range   TSH 0.509 0.450 - 4.500 uIU/mL     Assessment & Plan:   1. 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  Her previsit thyroid function tests are consistent with appropriate replacement.  She is advised to continue Synthroid 125 mcg p.o. daily before breakfast.     - We discussed about the correct intake of her thyroid hormone, on empty stomach at fasting, with water, separated by at least 30 minutes from breakfast and other medications,  and separated by more than 4 hours from calcium, iron, multivitamins, acid reflux medications (PPIs). -Patient is made aware of the fact that thyroid hormone replacement is needed for life, dose to be adjusted by periodic monitoring of thyroid function tests.   - She does not have clinical goiter, no need for thyroid imaging at this time.   2. Vitamin D deficiency -Her vitamin D has corrected at 41.  She is advised to continue vitamin D supplements at 5000 units of vitamin D3 daily.     - I advised patient to maintain close follow up with Alycia Rossetti, MD for primary care needs.     - Time spent on this patient care encounter:  20 minutes of which 50% was spent in  counseling and the rest reviewing  her current and  previous labs / studies and medications  doses and developing a plan for long term care. Kayren Eaves Holaway  participated in the discussions, expressed understanding, and voiced agreement with the above plans.  All questions were answered to her satisfaction. she is encouraged to contact clinic should she have any questions or concerns prior to her return visit.   Follow up plan: Return in about 6 months (around 01/30/2021) for F/U with Pre-visit Labs.  Glade Lloyd, MD Phone: [phone]  Fax: [phone]   08/02/2020, 7:29 PM

## 2020-08-19 ENCOUNTER — Other Ambulatory Visit: Payer: Self-pay | Admitting: "Endocrinology

## 2020-08-19 ENCOUNTER — Other Ambulatory Visit: Payer: Self-pay | Admitting: Family Medicine

## 2020-08-19 DIAGNOSIS — E890 Postprocedural hypothyroidism: Secondary | ICD-10-CM

## 2020-09-22 ENCOUNTER — Other Ambulatory Visit: Payer: Self-pay | Admitting: Family Medicine

## 2020-10-19 ENCOUNTER — Other Ambulatory Visit: Payer: Self-pay | Admitting: Family Medicine

## 2020-11-18 ENCOUNTER — Other Ambulatory Visit: Payer: Self-pay | Admitting: Family Medicine

## 2020-12-17 ENCOUNTER — Telehealth: Payer: Self-pay | Admitting: Family Medicine

## 2020-12-17 NOTE — Telephone Encounter (Signed)
Patient called requesting a refill on her meloxicam (MOBIC) 15 MG tablet She is in the process of finding another provider.   CB# [phone]

## 2020-12-23 MED ORDER — MELOXICAM 15 MG PO TABS: ORAL_TABLET | ORAL | 0 refills | Status: DC

## 2020-12-23 NOTE — Telephone Encounter (Signed)
Prescription sent to pharmacy.

## 2020-12-23 NOTE — Telephone Encounter (Signed)
Patient called to follow up on refill request for   meloxicam (MOBIC) 15 MG tablet [[id number]]    Pharmacy:   [facility] [phone] - [place], [place] [address] [place]  [address], [place] [address]  Phone:  [phone] Fax:  [phone]  DEA #:  [id number]  Patient has 6 pills left; she is aware this office will only give one courtesy refill to give her time to find another PCP.  Please call patient when refill has been called in at [phone].

## 2021-01-08 ENCOUNTER — Other Ambulatory Visit: Payer: Self-pay | Admitting: "Endocrinology

## 2021-01-08 DIAGNOSIS — E890 Postprocedural hypothyroidism: Secondary | ICD-10-CM

## 2021-01-10 ENCOUNTER — Telehealth: Payer: Self-pay | Admitting: "Endocrinology

## 2021-01-10 NOTE — Telephone Encounter (Signed)
Pt requesting refill  levothyroxine (SYNTHROID) 125 MCG tablet   [facility] [phone] - [place], [place] AT [place] Phone:  [phone]  Fax:  [phone]

## 2021-01-10 NOTE — Telephone Encounter (Signed)
Sent in

## 2021-01-25 LAB — VITAMIN D 25 HYDROXY (VIT D DEFICIENCY, FRACTURES): Vit D, 25-Hydroxy: 29.7 ng/mL — ABNORMAL LOW (ref 30.0–100.0)

## 2021-01-25 LAB — TSH: TSH: 0.526 u[IU]/mL (ref 0.450–4.500)

## 2021-01-25 LAB — T4, FREE: Free T4: 1.31 ng/dL (ref 0.82–1.77)

## 2021-01-31 ENCOUNTER — Encounter: Payer: Self-pay | Admitting: "Endocrinology

## 2021-01-31 ENCOUNTER — Ambulatory Visit (INDEPENDENT_AMBULATORY_CARE_PROVIDER_SITE_OTHER): Payer: 59 | Admitting: "Endocrinology

## 2021-01-31 ENCOUNTER — Other Ambulatory Visit: Payer: Self-pay

## 2021-01-31 VITALS — BP 132/85 | HR 73 | Wt 211.4 lb

## 2021-01-31 DIAGNOSIS — E890 Postprocedural hypothyroidism: Secondary | ICD-10-CM | POA: Diagnosis not present

## 2021-01-31 DIAGNOSIS — E559 Vitamin D deficiency, unspecified: Secondary | ICD-10-CM | POA: Diagnosis not present

## 2021-01-31 MED ORDER — LEVOTHYROXINE SODIUM 125 MCG PO TABS
ORAL_TABLET | ORAL | 3 refills | Status: DC
Start: 2021-01-31 — End: 2021-08-15

## 2021-01-31 NOTE — Progress Notes (Signed)
01/31/2021  Endocrinology follow-up note   Subjective:    Patient ID: Raven Mendoza, female    DOB: 11/06/1960, PCP Isaac Bliss, Rayford Halsted, MD   Past Medical History:  Diagnosis Date  . Allergy to bee sting 04/20/2008   Anaphylaxis  . Arthritis   . Hyperthyroidism   . Hypothyroidism    Past Surgical History:  Procedure Laterality Date  . c sections  1987 and 1990  . KNEE CLOSED REDUCTION  03/26/2012   Procedure: CLOSED MANIPULATION KNEE;  Surgeon: Mauri Pole, MD;  Location: [facility];  Service: Orthopedics;  Laterality: Left;  . radioactive iodine tx for thyroid  4 yrs ago,and  5 yrs ago  . REVISION TOTAL HIP ARTHROPLASTY Left 2019  . TOTAL HIP ARTHROPLASTY  10/24/2011   right  . TOTAL KNEE ARTHROPLASTY  01/16/2012   left  . TOTAL KNEE ARTHROPLASTY  03/26/2012   Procedure: TOTAL KNEE ARTHROPLASTY;  Surgeon: Mauri Pole, MD;  Location: [facility];  Service: Orthopedics;  Laterality: Right;   Social History   Socioeconomic History  . Marital status: Married    Spouse name: Not on file  . Number of children: Not on file  . Years of education: Not on file  . Highest education level: Not on file  Occupational History  . Not on file  Tobacco Use  . Smoking status: Never Smoker  . Smokeless tobacco: Never Used  Vaping Use  . Vaping Use: Never used  Substance and Sexual Activity  . Alcohol use: No  . Drug use: No  . Sexual activity: Not on file  Other Topics Concern  . 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    She is active and involved with the farm.   Social Determinants of Health   Financial Resource Strain: Not on file  Food Insecurity: Not on file  Transportation Needs: Not on file  Physical Activity: Not on file  Stress: Not on file  Social Connections: Not on file   Outpatient Encounter Medications as of 01/31/2021  Medication Sig  . Cholecalciferol (VITAMIN D3) 125 MCG (5000 UT) CAPS Take 1 capsule (5,000 Units total) by  mouth daily.  Marland Kitchen levothyroxine (SYNTHROID) 125 MCG tablet TAKE 1 TABLET BY MOUTH EVERY DAY BEFORE BREAKFAST  . meloxicam (MOBIC) 15 MG tablet TAKE 1 TABLET(15 MG) BY MOUTH DAILY  . [DISCONTINUED] levothyroxine (SYNTHROID) 125 MCG tablet TAKE 1 TABLET BY MOUTH EVERY DAY BEFORE BREAKFAST  . [DISCONTINUED] levothyroxine (SYNTHROID) 125 MCG tablet TAKE 1 TABLET BY MOUTH EVERY DAY BEFORE BREAKFAST   No facility-administered encounter medications on file as of 01/31/2021.   ALLERGIES: Allergies  Allergen Reactions  . Penicillins Swelling    VACCINATION STATUS: Immunization History  Administered Date(s) Administered  . Influenza,inj,Quad PF,6+ Mos 08/01/2019  . Td 08/12/2002  . Tdap 12/03/2013    HPI  60 year old female patient with medical history as above. 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  - Review of her medical records show that she did receive I-131 therapy on 2 occasions (18.2 mCi on 09/19/2005 and 26.1 mCi on 04/04/2006 due to refractory Graves' disease). - She was started on thyroid hormone replacements which was adjusted several times over the years.  She is currently on levothyroxine 125 mcg p.o. daily before breakfast.  Her previsit labs are consistent with appropriate  replacement.     She reports compliance to this medication. She has no new coplaints.  She denies heat nor cold intolerance. She denies dysphagia, shortness of breath, voice change. She has significant family history of various thyroid dysfunctions. She denies any family history of thyroid cancer. -She has steady weight since last visit.     Review of Systems Limited as above.  Objective:    BP 132/85   Pulse 73   Wt 211 lb 6.4 oz (95.9 kg)   LMP 10/23/2008   SpO2 96%   BMI 38.67 kg/m   Wt Readings from Last 3 Encounters:  01/31/21 211 lb 6.4 oz (95.9 kg)  08/02/20 208 lb 6.4 oz (94.5 kg)  01/06/20 205 lb 6.4 oz (93.2 kg)    Physical Exam    CMP ( most recent) CMP      Component Value Date/Time   NA 140 08/01/2019 1431   K 3.8 08/01/2019 1431   CL 105 08/01/2019 1431   CO2 23 08/01/2019 1431   GLUCOSE 121 (H) 08/01/2019 1431   BUN 23 08/01/2019 1431   CREATININE 0.67 08/01/2019 1431   CALCIUM 9.6 08/01/2019 1431   PROT 7.4 11/10/2016 0822   ALBUMIN 4.4 11/10/2016 0822   AST 25 11/10/2016 0822   ALT 17 11/10/2016 0822   ALKPHOS 65 11/10/2016 0822   BILITOT 0.5 11/10/2016 0822   GFRNONAA 85 11/10/2016 0822   GFRAA >89 11/10/2016 0822    Lipid Panel     Component Value Date/Time   CHOL 204 (H) 11/10/2016 0822   TRIG 51 11/10/2016 0822   HDL 101 11/10/2016 0822   CHOLHDL 2.0 11/10/2016 0822   VLDL 10 11/10/2016 0822   LDLCALC 93 11/10/2016 0822   Recent Results (from the past 2160 hour(s))  TSH     Status: None   Collection Time: 01/24/21  4:00 PM  Result Value Ref Range   TSH 0.526 0.450 - 4.500 uIU/mL  T4, free     Status: None   Collection Time: 01/24/21  4:00 PM  Result Value Ref Range   Free T4 1.31 0.82 - 1.77 ng/dL  VITAMIN D 25 Hydroxy (Vit-D Deficiency, Fractures)     Status: Abnormal   Collection Time: 01/24/21  4:00 PM  Result Value Ref Range   Vit D, 25-Hydroxy 29.7 (L) 30.0 - 100.0 ng/mL    Comment: Vitamin D deficiency has been defined by the [place] and an Endocrine Society practice guideline as a level of serum 25-OH vitamin D less than 20 ng/mL (1,2). The Endocrine Society went on to further define vitamin D insufficiency as a level between 21 and 29 ng/mL (2). 1. IOM (Institute of Medicine). 2010. Dietary reference    intakes for calcium and D. [place]: The    [employer]. 2. Holick MF, Binkley [place], Bischoff-Ferrari HA, et al.    Evaluation, treatment, and prevention of vitamin D    deficiency: an Endocrine Society clinical practice    guideline. JCEM. 2011 Jul; 96(7):1911-30.      Assessment & Plan:   1. Hypothyroidism following radioiodine therapy - She has RAI induced  hypothyroidism on 2 separate occasions in 2007 for refractory Graves' disease.  Her previsit thyroid function tests are c/w appropriate replacement.  She is advised to continue Synthroid 125 mcg p.o. daily before breakfast.     - We discussed about the correct intake of her thyroid hormone, on empty stomach at fasting, with water, separated by at least 30 minutes from breakfast and other medications,  and separated by more than 4 hours from calcium,  iron, multivitamins, acid reflux medications (PPIs). -Patient is made aware of the fact that thyroid hormone replacement is needed for life, dose to be adjusted by periodic monitoring of thyroid function tests.  - She does not have clinical goiter, no need for thyroid imaging at this time.   2. Vitamin D deficiency -Her vitamin D still low normal at 29.7.  She is advised to continue vitamin D supplements at 5000 units of vitamin D3 daily.     - I advised patient to maintain close follow up with Isaac Bliss, Rayford Halsted, MD for primary care needs.    I spent 25 minutes in the care of the patient today including review of labs from Thyroid Function, CMP, and other relevant labs ; imaging/biopsy records (current and previous including abstractions from other facilities); face-to-face time discussing  her lab results and symptoms, medications doses, her options of short and long term treatment based on the latest standards of care / guidelines;   and documenting the encounter.  Raven Mendoza  participated in the discussions, expressed understanding, and voiced agreement with the above plans.  All questions were answered to her satisfaction. she is encouraged to contact clinic should she have any questions or concerns prior to her return visit.   Follow up plan: Return in about 1 year (around 01/31/2022) for F/U with Pre-visit Labs.  Glade Lloyd, MD Phone: [phone]  Fax: [phone]   01/31/2021, 7:54 PM

## 2021-02-11 ENCOUNTER — Encounter: Payer: Self-pay | Admitting: Internal Medicine

## 2021-02-11 ENCOUNTER — Ambulatory Visit (INDEPENDENT_AMBULATORY_CARE_PROVIDER_SITE_OTHER): Payer: 59 | Admitting: Internal Medicine

## 2021-02-11 ENCOUNTER — Other Ambulatory Visit: Payer: Self-pay

## 2021-02-11 VITALS — BP 136/70 | HR 82 | Temp 97.7°F | Ht 62.0 in | Wt 214.3 lb

## 2021-02-11 DIAGNOSIS — E890 Postprocedural hypothyroidism: Secondary | ICD-10-CM | POA: Diagnosis not present

## 2021-02-11 DIAGNOSIS — Z1211 Encounter for screening for malignant neoplasm of colon: Secondary | ICD-10-CM | POA: Diagnosis not present

## 2021-02-11 DIAGNOSIS — M8949 Other hypertrophic osteoarthropathy, multiple sites: Secondary | ICD-10-CM

## 2021-02-11 DIAGNOSIS — E039 Hypothyroidism, unspecified: Secondary | ICD-10-CM | POA: Insufficient documentation

## 2021-02-11 DIAGNOSIS — Z23 Encounter for immunization: Secondary | ICD-10-CM

## 2021-02-11 DIAGNOSIS — E669 Obesity, unspecified: Secondary | ICD-10-CM

## 2021-02-11 DIAGNOSIS — Z1231 Encounter for screening mammogram for malignant neoplasm of breast: Secondary | ICD-10-CM

## 2021-02-11 DIAGNOSIS — M1990 Unspecified osteoarthritis, unspecified site: Secondary | ICD-10-CM | POA: Insufficient documentation

## 2021-02-11 DIAGNOSIS — M159 Polyosteoarthritis, unspecified: Secondary | ICD-10-CM

## 2021-02-11 MED ORDER — MELOXICAM 15 MG PO TABS: ORAL_TABLET | ORAL | 0 refills | Status: DC

## 2021-02-11 NOTE — Progress Notes (Signed)
New Patient Office Visit     This visit occurred during the SARS-CoV-2 public health emergency.  Safety protocols were in place, including screening questions prior to the visit, additional usage of staff PPE, and extensive cleaning of exam room while observing appropriate contact time as indicated for disinfecting solutions.    CC/Reason for Visit: Establish care, discuss chronic conditions, medication refills Previous PCP: [facility] Last Visit: Unknown  HPI: Raven Mendoza is a 60 y.o. female who is coming in today for the above mentioned reasons. Past Medical History is significant for: Hypothyroidism on levothyroxine following radioactive iodine ablation for Graves' disease.  She is followed by Dr. Dorris Fetch.  She also has a history of obesity, vitamin D deficiency and arthritis on meloxicam.  She needs a refill of her meloxicam today.  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  She does not smoke, she does not drink.  She has allergies to penicillin in her childhood, she was told it caused swelling.  She has had both hips and both knees replaced.  Her family history significant for father with multiple CVAs and a mother with dementia.  She is overdue for all cancer screening including cervical, breast, colon.  She has had 3 COVID vaccines, needs her fourth , she is overdue for shingles.   Past Medical/Surgical History: Past Medical History:  Diagnosis Date  . Allergy to bee sting 04/20/2008   Anaphylaxis  . Arthritis   . Hyperthyroidism   . Hypothyroidism   . Obesity (BMI 35.0-39.9 without comorbidity)   . Osteoarthritis   . Vitamin D deficiency     Past Surgical History:  Procedure Laterality Date  . c sections  1987 and 1990  . KNEE CLOSED REDUCTION  03/26/2012   Procedure: CLOSED MANIPULATION KNEE;  Surgeon: Mauri Pole, MD;  Location: [facility];  Service: Orthopedics;  Laterality: Left;  . radioactive iodine tx for thyroid  4 yrs ago,and  5 yrs ago  . REVISION  TOTAL HIP ARTHROPLASTY Left 2019  . TOTAL HIP ARTHROPLASTY  10/24/2011   right  . TOTAL KNEE ARTHROPLASTY  01/16/2012   left  . TOTAL KNEE ARTHROPLASTY  03/26/2012   Procedure: TOTAL KNEE ARTHROPLASTY;  Surgeon: Mauri Pole, MD;  Location: [facility];  Service: Orthopedics;  Laterality: Right;    Social History:  reports that she has never smoked. She has never used smokeless tobacco. She reports that she does not drink alcohol and does not use drugs.  Allergies: Allergies  Allergen Reactions  . Penicillins Swelling    Family History:  Family History  Problem Relation Age of Onset  . Cancer Brother 75       colon cancer  . Breast cancer Neg Hx      Current Outpatient Medications:  .  Cholecalciferol (VITAMIN D3) 125 MCG (5000 UT) CAPS, Take 1 capsule (5,000 Units total) by mouth daily., Disp: 90 capsule, Rfl: 1 .  levothyroxine (SYNTHROID) 125 MCG tablet, TAKE 1 TABLET BY MOUTH EVERY DAY BEFORE BREAKFAST, Disp: 90 tablet, Rfl: 3 .  meloxicam (MOBIC) 15 MG tablet, TAKE 1 TABLET(15 MG) BY MOUTH DAILY, Disp: 90 tablet, Rfl: 0  Review of Systems:  Constitutional: Denies fever, chills, diaphoresis, appetite change and fatigue.  HEENT: Denies photophobia, eye pain, redness, hearing loss, ear pain, congestion, sore throat, rhinorrhea, sneezing, mouth sores, trouble swallowing, neck pain, neck stiffness and tinnitus.   Respiratory: Denies SOB, DOE, cough, chest tightness,  and wheezing.  Cardiovascular: Denies chest pain, palpitations and leg swelling.  Gastrointestinal: Denies nausea, vomiting, abdominal pain, diarrhea, constipation, blood in stool and abdominal distention.  Genitourinary: Denies dysuria, urgency, frequency, hematuria, flank pain and difficulty urinating.  Endocrine: Denies: hot or cold intolerance, sweats, changes in hair or nails, polyuria, polydipsia. Musculoskeletal: Denies myalgias, back pain, joint swelling, arthralgias and gait problem.  Skin: Denies pallor, rash  and wound.  Neurological: Denies dizziness, seizures, syncope, weakness, light-headedness, numbness and headaches.  Hematological: Denies adenopathy. Easy bruising, personal or family bleeding history  Psychiatric/Behavioral: Denies suicidal ideation, mood changes, confusion, nervousness, sleep disturbance and agitation    Physical Exam: Vitals:   02/11/21 1514  BP: 136/70  Pulse: 82  Temp: 97.7 F (36.5 C)  TempSrc: Oral  SpO2: 96%  Weight: 214 lb 4.8 oz (97.2 kg)  Height: 5\' 2"  (1.575 m)   Body mass index is 39.2 kg/m.  Constitutional: NAD, calm, comfortable, obese Eyes: PERRL, lids and conjunctivae normal, wears corrective lenses ENMT: Mucous membranes are moist. Respiratory: clear to auscultation bilaterally, no wheezing, no crackles. Normal respiratory effort. No accessory muscle use.  Cardiovascular: Regular rate and rhythm, no murmurs / rubs / gallops. No extremity edema.  Neurologic: Grossly intact and nonfocal Psychiatric: Normal judgment and insight. Alert and oriented x 3. Normal mood.    Impression and Plan:  Colon cancer screening  - Plan: Ambulatory referral to Gastroenterology  Postablative hypothyroidism -Recent TSH was within normal limits at 0.526, she is on levothyroxine 125 mcg daily, she is followed by endocrinology.  Obesity (BMI 35.0-39.9 without comorbidity) -Discussed healthy lifestyle, including increased physical activity and better food choices to promote weight loss.   Primary osteoarthritis involving multiple joints  - Plan: meloxicam (MOBIC) 15 MG tablet  Encounter for screening mammogram for malignant neoplasm of breast  - Plan: MM Digital Screening  Need for shingles vaccine -For shingles vaccine today.     Patient Instructions  -Nice seeing you today!!  -First shingles vaccine today.  -Schedule follow up in 3 months for your physical. Please come in fasting that day.     Lelon Frohlich, MD [facility]

## 2021-02-11 NOTE — Patient Instructions (Signed)
-  Nice seeing you today!!  -First shingles vaccine today.  -Schedule follow up in 3 months for your physical. Please come in fasting that day.

## 2021-03-08 ENCOUNTER — Encounter (HOSPITAL_COMMUNITY): Payer: Self-pay

## 2021-03-08 ENCOUNTER — Other Ambulatory Visit: Payer: Self-pay

## 2021-03-08 ENCOUNTER — Ambulatory Visit (HOSPITAL_COMMUNITY)
Admission: EM | Admit: 2021-03-08 | Discharge: 2021-03-08 | Disposition: A | Discharge status: Home / Self Care | Payer: 59 | Attending: Emergency Medicine | Admitting: Emergency Medicine

## 2021-03-08 DIAGNOSIS — W57XXXA Bitten or stung by nonvenomous insect and other nonvenomous arthropods, initial encounter: Secondary | ICD-10-CM | POA: Diagnosis not present

## 2021-03-08 DIAGNOSIS — R591 Generalized enlarged lymph nodes: Secondary | ICD-10-CM

## 2021-03-08 DIAGNOSIS — S0006XA Insect bite (nonvenomous) of scalp, initial encounter: Secondary | ICD-10-CM | POA: Diagnosis not present

## 2021-03-08 MED ORDER — DOXYCYCLINE HYCLATE 100 MG PO CAPS: 100.0000 mg | ORAL_CAPSULE | Freq: Two times a day (BID) | ORAL | 0 refills | Status: DC

## 2021-03-08 NOTE — Discharge Instructions (Addendum)
Take the Doxycycline twice a day for the next 10 days.    You can take Ibuprofen and/or Tylenol as needed for pain relief and fever reduction.   You can apply a warm compress to your head/neck for comfort.   If you develop any flu like symptoms, come back and we can draw your blood for tick-borne illnesses.    Return or go to the Emergency Department if symptoms worsen or do not improve in the next few days.

## 2021-03-08 NOTE — ED Triage Notes (Signed)
Pt c/o knot on back of neck on right side which is tender and sore, starting Sunday.

## 2021-03-08 NOTE — ED Provider Notes (Signed)
[place]    CSN: [id number] Arrival date & time: 03/08/21  1922      History   Chief Complaint Chief Complaint  Patient presents with   Knot on Head    HPI Raven Mendoza is a 60 y.o. female.   Patient here for evaluation of painful knot to the back of her neck that she first noticed on Sunday.  Denies any recent illnesses or infections.  Patient does report that she removed a tick from the top of her head several days prior and states that the tick was engorged.  Reports bump is painful to palpation and has gotten larger since Sunday.  Has not tried any OTC medications or treatments.  Denies any specific alleviating or aggravating factors.  Denies any fevers, chest pain, shortness of breath, N/V/D, numbness, tingling, weakness, abdominal pain, or headaches.     The history is provided by the patient.   Past Medical History:  Diagnosis Date   Allergy to bee sting 04/20/2008   Anaphylaxis   Arthritis    Hyperthyroidism    Hypothyroidism    Obesity (BMI 35.0-39.9 without comorbidity)    Osteoarthritis    Vitamin D deficiency     Patient Active Problem List   Diagnosis Date Noted   Hypothyroidism    Obesity (BMI 35.0-39.9 without comorbidity)    Osteoarthritis    OA (osteoarthritis) of knee 08/01/2019   S/P hip replacement, bilateral 08/01/2019   Family history of colon cancer 12/03/2013   Hypothyroidism following radioiodine therapy 11/24/2013   Vitamin D deficiency 11/24/2013   History of knee replacement 03/26/2012   Allergy to bee sting 04/20/2008    Past Surgical History:  Procedure Laterality Date   c sections  1987 and [place]  03/26/2012   Procedure: CLOSED MANIPULATION KNEE;  Surgeon: Mauri Pole, MD;  Location: [facility];  Service: Orthopedics;  Laterality: Left;   radioactive iodine tx for thyroid  4 yrs ago,and  5 yrs ago   [place] Left 2019   TOTAL HIP ARTHROPLASTY  10/24/2011   right   TOTAL  KNEE ARTHROPLASTY  01/16/2012   left   TOTAL KNEE ARTHROPLASTY  03/26/2012   Procedure: TOTAL KNEE ARTHROPLASTY;  Surgeon: Mauri Pole, MD;  Location: [facility];  Service: Orthopedics;  Laterality: Right;    OB History   No obstetric history on file.      Home Medications    Prior to Admission medications   Medication Sig Start Date End Date Taking? Authorizing Provider  Cholecalciferol (VITAMIN D3) 125 MCG (5000 UT) CAPS Take 1 capsule (5,000 Units total) by mouth daily. 01/06/20  Yes Nida, Marella Chimes, MD  doxycycline (VIBRAMYCIN) 100 MG capsule Take 1 capsule (100 mg total) by mouth 2 (two) times daily. 03/08/21  Yes Pearson Forster, NP  levothyroxine (SYNTHROID) 125 MCG tablet TAKE 1 TABLET BY MOUTH EVERY DAY BEFORE BREAKFAST 01/31/21  Yes Nida, Marella Chimes, MD  meloxicam (MOBIC) 15 MG tablet TAKE 1 TABLET(15 MG) BY MOUTH DAILY 02/11/21  Yes Isaac Bliss, Rayford Halsted, MD    Family History Family History  Problem Relation Age of Onset   Cancer Brother 46       colon cancer   Diabetes Paternal Grandmother    Breast cancer Neg Hx     Social History Social History   Tobacco Use   Smoking status: Never   Smokeless tobacco: Never  Vaping Use   Vaping  Use: Never used  Substance Use Topics   Alcohol use: No   Drug use: No     Allergies   Penicillins   Review of Systems Review of Systems  Hematological:  Positive for adenopathy.  All other systems reviewed and are negative.   Physical Exam Triage Vital Signs ED Triage Vitals  Enc Vitals Group     BP 03/08/21 1941 (!) 170/90     Pulse Rate 03/08/21 1938 76     Resp 03/08/21 1938 18     Temp 03/08/21 1938 98.1 F (36.7 C)     Temp src --      SpO2 03/08/21 1938 99 %     Weight --      Height --      Head Circumference --      Peak Flow --      Pain Score 03/08/21 1935 5     Pain Loc --      Pain Edu? --      Excl. in [place]? --    No data found.  Updated Vital Signs BP (!) 170/90 Comment: Ruberta Holck NP notified  Pulse 76   Temp 98.1 F (36.7 C)   Resp 18   LMP 10/23/2008   SpO2 99%   Visual Acuity Right Eye Distance:   Left Eye Distance:   Bilateral Distance:    Right Eye Near:   Left Eye Near:    Bilateral Near:     Physical Exam Vitals and nursing note reviewed.  Constitutional:      General: She is not in acute distress.    Appearance: Normal appearance. She is not ill-appearing, toxic-appearing or diaphoretic.  HENT:     Head: Normocephalic and atraumatic.  Eyes:     Conjunctiva/sclera: Conjunctivae normal.  Cardiovascular:     Rate and Rhythm: Normal rate.     Pulses: Normal pulses.  Pulmonary:     Effort: Pulmonary effort is normal.  Abdominal:     General: Abdomen is flat.  Musculoskeletal:        General: Normal range of motion.     Cervical back: Normal range of motion.  Lymphadenopathy:     Head:     Right side of head: Occipital (tender, non-mobile) adenopathy present.     Left side of head: No occipital adenopathy.     Cervical: No cervical adenopathy.  Skin:    General: Skin is warm and dry.     Findings: Lesion (tick bite to the top of head with some erythema surrounding) present.  Neurological:     General: No focal deficit present.     Mental Status: She is alert and oriented to person, place, and time.  Psychiatric:        Mood and Affect: Mood normal.     UC Treatments / Results  Labs (all labs ordered are listed, but only abnormal results are displayed) Labs Reviewed - No data to display  EKG   Radiology No results found.  Procedures Procedures (including critical care time)  Medications Ordered in UC Medications - No data to display  Initial Impression / Assessment and Plan / UC Course  I have reviewed the triage vital signs and the nursing notes.  Pertinent labs & imaging results that were available during my care of the patient were reviewed by me and considered in my medical decision making (see chart for  details).    Lymphadenopathy to the right occipital and tick bite.  Due  to the location of the tick bite, it was hard to fully exam area but there did appear to be some erythema migrans.  As tick was engorged, we will go ahead and treat for possible tick-borne illnesses with doxycycline twice a day for the next 10 days.  May take Ibuprofen and/or Tylenol as needed for pain and fevers.  Patient instructed to return for further evaluation if she develops any flu-like  symptoms in the next few weeks.  Follow up with primary care as needed.  Final Clinical Impressions(s) / UC Diagnoses   Final diagnoses:  Lymphadenopathy  Tick bite of scalp, initial encounter     Discharge Instructions      Take the Doxycycline twice a day for the next 10 days.    You can take Ibuprofen and/or Tylenol as needed for pain relief and fever reduction.   You can apply a warm compress to your head/neck for comfort.   If you develop any flu like symptoms, come back and we can draw your blood for tick-borne illnesses.    Return or go to the Emergency Department if symptoms worsen or do not improve in the next few days.      ED Prescriptions     Medication Sig Dispense Auth. Provider   doxycycline (VIBRAMYCIN) 100 MG capsule Take 1 capsule (100 mg total) by mouth 2 (two) times daily. 20 capsule Pearson Forster, NP      PDMP not reviewed this encounter.   Pearson Forster, NP 03/08/21 2007

## 2021-03-25 ENCOUNTER — Other Ambulatory Visit: Payer: Self-pay | Admitting: Family Medicine

## 2021-03-25 DIAGNOSIS — M8949 Other hypertrophic osteoarthropathy, multiple sites: Secondary | ICD-10-CM

## 2021-03-25 DIAGNOSIS — M159 Polyosteoarthritis, unspecified: Secondary | ICD-10-CM

## 2021-04-20 ENCOUNTER — Telehealth: Payer: Self-pay | Admitting: Internal Medicine

## 2021-04-20 ENCOUNTER — Encounter: Payer: Self-pay | Admitting: Gastroenterology

## 2021-04-20 NOTE — Telephone Encounter (Signed)
Opened in error

## 2021-05-06 ENCOUNTER — Ambulatory Visit
Admission: RE | Admit: 2021-05-06 | Discharge: 2021-05-06 | Disposition: A | Discharge status: Home / Self Care | Payer: 59 | Source: Ambulatory Visit | Attending: Internal Medicine | Admitting: Internal Medicine

## 2021-05-06 ENCOUNTER — Other Ambulatory Visit: Payer: Self-pay

## 2021-05-06 DIAGNOSIS — Z1231 Encounter for screening mammogram for malignant neoplasm of breast: Secondary | ICD-10-CM

## 2021-05-17 ENCOUNTER — Other Ambulatory Visit (HOSPITAL_COMMUNITY)
Admission: RE | Admit: 2021-05-17 | Discharge: 2021-05-17 | Disposition: A | Discharge status: Home / Self Care | Payer: 59 | Source: Ambulatory Visit | Attending: Internal Medicine | Admitting: Internal Medicine

## 2021-05-17 ENCOUNTER — Other Ambulatory Visit: Payer: Self-pay

## 2021-05-17 ENCOUNTER — Encounter: Payer: Self-pay | Admitting: Internal Medicine

## 2021-05-17 ENCOUNTER — Other Ambulatory Visit: Payer: Self-pay | Admitting: Internal Medicine

## 2021-05-17 ENCOUNTER — Ambulatory Visit (INDEPENDENT_AMBULATORY_CARE_PROVIDER_SITE_OTHER): Payer: 59 | Admitting: Internal Medicine

## 2021-05-17 VITALS — BP 130/80 | HR 60 | Temp 97.7°F | Ht 62.0 in | Wt 217.0 lb

## 2021-05-17 DIAGNOSIS — Z124 Encounter for screening for malignant neoplasm of cervix: Secondary | ICD-10-CM | POA: Insufficient documentation

## 2021-05-17 DIAGNOSIS — Z23 Encounter for immunization: Secondary | ICD-10-CM | POA: Diagnosis not present

## 2021-05-17 DIAGNOSIS — E559 Vitamin D deficiency, unspecified: Secondary | ICD-10-CM | POA: Diagnosis not present

## 2021-05-17 DIAGNOSIS — Z0000 Encounter for general adult medical examination without abnormal findings: Secondary | ICD-10-CM

## 2021-05-17 DIAGNOSIS — E669 Obesity, unspecified: Secondary | ICD-10-CM

## 2021-05-17 DIAGNOSIS — E785 Hyperlipidemia, unspecified: Secondary | ICD-10-CM | POA: Insufficient documentation

## 2021-05-17 DIAGNOSIS — R7302 Impaired glucose tolerance (oral): Secondary | ICD-10-CM | POA: Insufficient documentation

## 2021-05-17 DIAGNOSIS — E890 Postprocedural hypothyroidism: Secondary | ICD-10-CM | POA: Diagnosis not present

## 2021-05-17 DIAGNOSIS — E119 Type 2 diabetes mellitus without complications: Secondary | ICD-10-CM | POA: Insufficient documentation

## 2021-05-17 LAB — COMPREHENSIVE METABOLIC PANEL
ALT: 15 U/L (ref 0–35)
AST: 18 U/L (ref 0–37)
Albumin: 4.3 g/dL (ref 3.5–5.2)
Alkaline Phosphatase: 85 U/L (ref 39–117)
BUN: 24 mg/dL — ABNORMAL HIGH (ref 6–23)
CO2: 26 mEq/L (ref 19–32)
Calcium: 9.7 mg/dL (ref 8.4–10.5)
Chloride: 103 mEq/L (ref 96–112)
Creatinine, Ser: 0.6 mg/dL (ref 0.40–1.20)
GFR: 96.23 mL/min (ref >60.00)
Glucose, Bld: 89 mg/dL (ref 70–99)
Potassium: 4.1 mEq/L (ref 3.5–5.1)
Sodium: 137 mEq/L (ref 135–145)
Total Bilirubin: 0.7 mg/dL (ref 0.2–1.2)
Total Protein: 7.4 g/dL (ref 6.0–8.3)

## 2021-05-17 LAB — CBC WITH DIFFERENTIAL/PLATELET
Basophils Absolute: 0.1 10*3/uL (ref 0.0–0.1)
Basophils Relative: 0.9 % (ref 0.0–3.0)
Eosinophils Absolute: 0.2 10*3/uL (ref 0.0–0.7)
Eosinophils Relative: 3.7 % (ref 0.0–5.0)
HCT: 41.5 % (ref 36.0–46.0)
Hemoglobin: 14.1 g/dL (ref 12.0–15.0)
Lymphocytes Relative: 32.8 % (ref 12.0–46.0)
Lymphs Abs: 2.1 10*3/uL (ref 0.7–4.0)
MCHC: 34.1 g/dL (ref 30.0–36.0)
MCV: 86.7 fl (ref 78.0–100.0)
Monocytes Absolute: 0.6 10*3/uL (ref 0.1–1.0)
Monocytes Relative: 9.5 % (ref 3.0–12.0)
Neutro Abs: 3.3 10*3/uL (ref 1.4–7.7)
Neutrophils Relative %: 53.1 % (ref 43.0–77.0)
Platelets: 292 10*3/uL (ref 150.0–400.0)
RBC: 4.78 Mil/uL (ref 3.87–5.11)
RDW: 14.2 % (ref 11.5–15.5)
WBC: 6.3 10*3/uL (ref 4.0–10.5)

## 2021-05-17 LAB — HEMOGLOBIN A1C: Hgb A1c MFr Bld: 6.3 % (ref 4.6–6.5)

## 2021-05-17 LAB — LIPID PANEL
Cholesterol: 205 mg/dL — ABNORMAL HIGH (ref 0–200)
HDL: 81.6 mg/dL (ref >39.00)
LDL Cholesterol: 111 mg/dL — ABNORMAL HIGH (ref 0–99)
NonHDL: 123.3
Total CHOL/HDL Ratio: 3
Triglycerides: 61 mg/dL (ref 0.0–149.0)
VLDL: 12.2 mg/dL (ref 0.0–40.0)

## 2021-05-17 LAB — VITAMIN B12: Vitamin B-12: 231 pg/mL (ref 211–911)

## 2021-05-17 LAB — VITAMIN D 25 HYDROXY (VIT D DEFICIENCY, FRACTURES): VITD: 49.8 ng/mL (ref 30.00–100.00)

## 2021-05-17 LAB — TSH: TSH: 0.74 u[IU]/mL (ref 0.35–5.50)

## 2021-05-17 NOTE — Patient Instructions (Addendum)
-  Nice seeing you today!!  -Lab work today; will notify you once results are available.  -Flu and final singles vaccines today.  -Make sure to schedule your COVID boosters at the pharmacy.  -Remember to schedule eye and dental exams.  -Schedule follow up in 1 year or sooner as needed.

## 2021-05-17 NOTE — Addendum Note (Signed)
Addended by: Amanda Cockayne on: 05/17/2021 08:05 AM   Modules accepted: Orders

## 2021-05-17 NOTE — Addendum Note (Signed)
Addended by: Westley Hummer B on: 05/17/2021 08:35 AM   Modules accepted: Orders

## 2021-05-17 NOTE — Progress Notes (Signed)
Established Patient Office Visit     This visit occurred during the SARS-CoV-2 public health emergency.  Safety protocols were in place, including screening questions prior to the visit, additional usage of staff PPE, and extensive cleaning of exam room while observing appropriate contact time as indicated for disinfecting solutions.    CC/Reason for Visit: Annual preventive exam  HPI: Raven Mendoza is a 60 y.o. female who is coming in today for the above mentioned reasons. 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  She has no acute complaints today.  She is overdue for routine eye and dental care.  She exercises routinely.  She had a mammogram earlier this month that was negative, she is scheduled for her initial screening colonoscopy in October, she is overdue for cervical cancer screening.  She has had 2 COVID vaccines, has not yet had booster doses, she is due for her second shingles vaccine.   Past Medical/Surgical History: Past Medical History:  Diagnosis Date   Allergy to bee sting 04/20/2008   Anaphylaxis   Arthritis    Hyperthyroidism    Hypothyroidism    Obesity (BMI 35.0-39.9 without comorbidity)    Osteoarthritis    Vitamin D deficiency     Past Surgical History:  Procedure Laterality Date   c sections  1987 and 1990   KNEE CLOSED REDUCTION  03/26/2012   Procedure: CLOSED MANIPULATION KNEE;  Surgeon: Mauri Pole, MD;  Location: [facility];  Service: Orthopedics;  Laterality: Left;   radioactive iodine tx for thyroid  4 yrs ago,and  5 yrs ago   [place] Left 2019   TOTAL HIP ARTHROPLASTY  10/24/2011   right   TOTAL KNEE ARTHROPLASTY  01/16/2012   left   TOTAL KNEE ARTHROPLASTY  03/26/2012   Procedure: TOTAL KNEE ARTHROPLASTY;  Surgeon: Mauri Pole, MD;  Location: [facility];  Service: Orthopedics;  Laterality: Right;     Social History:  reports that she has never smoked. She has never used smokeless tobacco. She reports that she does not drink alcohol and does not use drugs.  Allergies: Allergies  Allergen Reactions   Penicillins Swelling    Pt reports remembers hand swelling, but not sure what else swelled up. Reports reaction was as a child.    Family History:  Family History  Problem Relation Age of Onset   Cancer Brother 71       colon cancer   Diabetes Paternal Grandmother    Breast cancer Neg Hx      Current Outpatient Medications:    Cholecalciferol (VITAMIN D3) 125 MCG (5000 UT) CAPS, Take 1 capsule (5,000 Units total) by mouth daily., Disp: 90 capsule, Rfl: 1   levothyroxine (SYNTHROID) 125 MCG tablet, TAKE 1 TABLET BY MOUTH EVERY DAY BEFORE BREAKFAST, Disp: 90 tablet, Rfl: 3   meloxicam (MOBIC) 15 MG tablet, TAKE 1 TABLET(15 MG) BY MOUTH DAILY, Disp: 90 tablet, Rfl: 0  Review of Systems:  Constitutional: Denies fever, chills, diaphoresis, appetite change and fatigue.  HEENT: Denies photophobia, eye pain, redness, hearing loss, ear pain, congestion, sore throat, rhinorrhea, sneezing, mouth sores, trouble swallowing, neck pain, neck stiffness and tinnitus.   Respiratory: Denies SOB, DOE, cough, chest tightness,  and wheezing.   Cardiovascular: Denies chest pain, palpitations and leg swelling.  Gastrointestinal: Denies nausea, vomiting, abdominal pain, diarrhea, constipation, blood in stool and abdominal distention.  Genitourinary: Denies dysuria, urgency,  frequency, hematuria, flank pain and difficulty urinating.  Endocrine: Denies: hot or cold intolerance, sweats, changes in hair or nails, polyuria, polydipsia. Musculoskeletal: Denies myalgias, back pain, joint swelling, arthralgias and gait problem.  Skin: Denies pallor, rash and wound.  Neurological: Denies dizziness, seizures, syncope, weakness, light-headedness, numbness and headaches.  Hematological: Denies adenopathy. Easy  bruising, personal or family bleeding history  Psychiatric/Behavioral: Denies suicidal ideation, mood changes, confusion, nervousness, sleep disturbance and agitation    Physical Exam: Vitals:   05/17/21 0722  BP: 130/80  Pulse: 60  Temp: 97.7 F (36.5 C)  TempSrc: Oral  SpO2: 97%  Weight: 217 lb (98.4 kg)  Height: '5\' 2"'$  (1.575 m)    Body mass index is 39.69 kg/m.   Constitutional: NAD, calm, comfortable, obese Eyes: PERRL, lids and conjunctivae normal, wears corrective lenses ENMT: Mucous membranes are moist. Posterior pharynx clear of any exudate or lesions. Normal dentition. Tympanic membrane is pearly white, no erythema or bulging. Neck: normal, supple, no masses, no thyromegaly Respiratory: clear to auscultation bilaterally, no wheezing, no crackles. Normal respiratory effort. No accessory muscle use.  Cardiovascular: Regular rate and rhythm, no murmurs / rubs / gallops. No extremity edema. 2+ pedal pulses. No carotid bruits.  Abdomen: no tenderness, no masses palpated. No hepatosplenomegaly. Bowel sounds positive.  Musculoskeletal: no clubbing / cyanosis. No joint deformity upper and lower extremities. Good ROM, no contractures. Normal muscle tone.  Skin: no rashes, lesions, ulcers. No induration Neurologic: CN 2-12 grossly intact. Sensation intact, DTR normal. Strength 5/5 in all 4.  Psychiatric: Normal judgment and insight. Alert and oriented x 3. Normal mood.    Impression and Plan:  Encounter for preventive health examination -She is overdue for eye and dental care. -She has received flu and second shingles vaccines today.  She is overdue for COVID boosters. -Screening labs today. -Healthy lifestyle discussed in detail. -Negative mammogram in August 2022. -Scheduled for colonoscopy in October. -Pap smear performed in office today.  Obesity (BMI 35.0-39.9 without comorbidity) -Discussed healthy lifestyle, including increased physical activity and better food  choices to promote weight loss.  Hypothyroidism following radioiodine therapy  -Followed by endocrinology.  Vitamin D deficiency  - Plan: VITAMIN D 25 Hydroxy (Vit-D Deficiency, Fractures)  Need for influenza vaccination -Influenza vaccine administered today.  Need for shingles vaccine -Second shingles vaccine administered today.    Patient Instructions  -Nice seeing you today!!  -Lab work today; will notify you once results are available.  -Flu and final singles vaccines today.  -Make sure to schedule your COVID boosters at the pharmacy.  -Remember to schedule eye and dental exams.  -Schedule follow up in 1 year or sooner as needed.     Lelon Frohlich, MD [facility]

## 2021-05-18 LAB — CYTOLOGY - PAP
Comment: NEGATIVE
Diagnosis: NEGATIVE
High risk HPV: NEGATIVE

## 2021-06-06 ENCOUNTER — Ambulatory Visit (AMBULATORY_SURGERY_CENTER): Payer: 59 | Admitting: *Deleted

## 2021-06-06 ENCOUNTER — Other Ambulatory Visit: Payer: Self-pay

## 2021-06-06 VITALS — Ht 62.0 in | Wt 217.0 lb

## 2021-06-06 DIAGNOSIS — Z800 Family history of malignant neoplasm of digestive organs: Secondary | ICD-10-CM

## 2021-06-06 MED ORDER — NA SULFATE-K SULFATE-MG SULF 17.5-3.13-1.6 GM/177ML PO SOLN: 1.0000 | ORAL | 0 refills | Status: DC

## 2021-06-06 NOTE — Progress Notes (Signed)

## 2021-06-21 ENCOUNTER — Encounter: Payer: Self-pay | Admitting: Gastroenterology

## 2021-06-21 ENCOUNTER — Other Ambulatory Visit: Payer: Self-pay

## 2021-06-21 ENCOUNTER — Ambulatory Visit (AMBULATORY_SURGERY_CENTER): Payer: 59 | Admitting: Gastroenterology

## 2021-06-21 VITALS — BP 134/72 | HR 61 | Temp 97.9°F | Resp 16 | Ht 62.0 in | Wt 217.0 lb

## 2021-06-21 DIAGNOSIS — Z1211 Encounter for screening for malignant neoplasm of colon: Secondary | ICD-10-CM

## 2021-06-21 DIAGNOSIS — Z800 Family history of malignant neoplasm of digestive organs: Secondary | ICD-10-CM

## 2021-06-21 DIAGNOSIS — D122 Benign neoplasm of ascending colon: Secondary | ICD-10-CM

## 2021-06-21 MED ORDER — SODIUM CHLORIDE 0.9 % IV SOLN
500.0000 mL | Freq: Once | INTRAVENOUS | Status: DC
Start: 2021-06-21 — End: 2021-06-21

## 2021-06-21 NOTE — Progress Notes (Signed)
Sedate, gd SR, tolerated procedure well, VSS, report to RN 

## 2021-06-21 NOTE — Progress Notes (Signed)
Called to room to assist during endoscopic procedure.  Patient ID and intended procedure confirmed with present staff. Received instructions for my participation in the procedure from the performing physician.  

## 2021-06-21 NOTE — Patient Instructions (Signed)
Handouts given for polyps, diverticulosis and hemorrhoids.  Also for High Fiber diet. Recommend taking FiberCon 1-2 tablets daily with plenty of water. Await pathology results. Resume previous diet and medications.   YOU HAD AN ENDOSCOPIC PROCEDURE TODAY AT [facility]:   Refer to the procedure report that was given to you for any specific questions about what was found during the examination.  If the procedure report does not answer your questions, please call your gastroenterologist to clarify.  If you requested that your care partner not be given the details of your procedure findings, then the procedure report has been included in a sealed envelope for you to review at your convenience later.  YOU SHOULD EXPECT: Some feelings of bloating in the abdomen. Passage of more gas than usual.  Walking can help get rid of the air that was put into your GI tract during the procedure and reduce the bloating. If you had a lower endoscopy (such as a colonoscopy or flexible sigmoidoscopy) you may notice spotting of blood in your stool or on the toilet paper. If you underwent a bowel prep for your procedure, you may not have a normal bowel movement for a few days.  Please Note:  You might notice some irritation and congestion in your nose or some drainage.  This is from the oxygen used during your procedure.  There is no need for concern and it should clear up in a day or so.  SYMPTOMS TO REPORT IMMEDIATELY:  Following lower endoscopy (colonoscopy or flexible sigmoidoscopy):  Excessive amounts of blood in the stool  Significant tenderness or worsening of abdominal pains  Swelling of the abdomen that is new, acute  Fever of 100F or higher  For urgent or emergent issues, a gastroenterologist can be reached at any hour by calling [phone]. Do not use MyChart messaging for urgent concerns.    DIET:  We do recommend a small meal at first, but then you may proceed to your regular diet.   Drink plenty of fluids but you should avoid alcoholic beverages for 24 hours.  ACTIVITY:  You should plan to take it easy for the rest of today and you should NOT DRIVE or use heavy machinery until tomorrow (because of the sedation medicines used during the test).    FOLLOW UP: Our staff will call the number listed on your records 48-72 hours following your procedure to check on you and address any questions or concerns that you may have regarding the information given to you following your procedure. If we do not reach you, we will leave a message.  We will attempt to reach you two times.  During this call, we will ask if you have developed any symptoms of COVID 19. If you develop any symptoms (ie: fever, flu-like symptoms, shortness of breath, cough etc.) before then, please call [phone].  If you test positive for Covid 19 in the 2 weeks post procedure, please call and report this information to [place].    If any biopsies were taken you will be contacted by phone or by letter within the next 1-3 weeks.  Please call us at [phone] if you have not heard about the biopsies in 3 weeks.    SIGNATURES/CONFIDENTIALITY: You and/or your care partner have signed paperwork which will be entered into your electronic medical record.  These signatures attest to the fact that that the information above on your After Visit Summary has been reviewed and is understood.  Full responsibility  of the confidentiality of this discharge information lies with you and/or your care-partner.

## 2021-06-21 NOTE — Progress Notes (Signed)
GASTROENTEROLOGY PROCEDURE H&P NOTE   Primary Care Physician: Isaac Bliss, Rayford Halsted, MD  HPI: Raven Mendoza is a 60 y.o. female who presents for Colonoscopy for screening.  Past Medical History:  Diagnosis Date   Allergy to bee sting 04/20/2008   Anaphylaxis   Arthritis    Hyperthyroidism    Hypothyroidism    Obesity (BMI 35.0-39.9 without comorbidity)    Osteoarthritis    Vitamin D deficiency    Past Surgical History:  Procedure Laterality Date   c sections  1987 and 1990   COLONOSCOPY  03/24/2004   Dr.Johnson normal excellent prep   KNEE CLOSED REDUCTION  03/26/2012   Procedure: CLOSED MANIPULATION KNEE;  Surgeon: Mauri Pole, MD;  Location: [facility];  Service: Orthopedics;  Laterality: Left;   radioactive iodine tx for thyroid  4 yrs ago,and  5 yrs ago   [place] Left 2019   TOTAL HIP ARTHROPLASTY  10/24/2011   right   TOTAL KNEE ARTHROPLASTY  01/16/2012   left   TOTAL KNEE ARTHROPLASTY  03/26/2012   Procedure: TOTAL KNEE ARTHROPLASTY;  Surgeon: Mauri Pole, MD;  Location: [facility];  Service: Orthopedics;  Laterality: Right;   Current Outpatient Medications  Medication Sig Dispense Refill   Cholecalciferol (VITAMIN D3) 125 MCG (5000 UT) CAPS Take 1 capsule (5,000 Units total) by mouth daily. 90 capsule 1   levothyroxine (SYNTHROID) 125 MCG tablet TAKE 1 TABLET BY MOUTH EVERY DAY BEFORE BREAKFAST 90 tablet 3   meloxicam (MOBIC) 15 MG tablet TAKE 1 TABLET(15 MG) BY MOUTH DAILY 90 tablet 0   Current Facility-Administered Medications  Medication Dose Route Frequency Provider Last Rate Last Admin   0.9 %  sodium chloride infusion  500 mL Intravenous Once Mansouraty, Telford Nab., MD        Current Outpatient Medications:    Cholecalciferol (VITAMIN D3) 125 MCG (5000 UT) CAPS, Take 1 capsule (5,000 Units total) by mouth daily., Disp: 90 capsule, Rfl: 1   levothyroxine (SYNTHROID) 125 MCG tablet, TAKE 1 TABLET BY MOUTH EVERY DAY BEFORE  BREAKFAST, Disp: 90 tablet, Rfl: 3   meloxicam (MOBIC) 15 MG tablet, TAKE 1 TABLET(15 MG) BY MOUTH DAILY, Disp: 90 tablet, Rfl: 0  Current Facility-Administered Medications:    0.9 %  sodium chloride infusion, 500 mL, Intravenous, Once, Mansouraty, Telford Nab., MD Allergies  Allergen Reactions   Penicillins Swelling    Pt reports remembers hand swelling, but not sure what else swelled up. Reports reaction was as a child.   Family History  Problem Relation Age of Onset   Colon cancer Brother 62   Cancer Brother 26       colon cancer   Diabetes Paternal Grandmother    Breast cancer Neg Hx    Colon polyps Neg Hx    Esophageal cancer Neg Hx    Rectal cancer Neg Hx    Stomach cancer Neg Hx    Social History   Socioeconomic History   Marital status: Married    Spouse name: Not on file   Number of children: Not on file   Years of education: Not on file   Highest education level: Not on file  Occupational History   Not on file  Tobacco Use   Smoking status: Never   Smokeless tobacco: Never  Vaping Use   Vaping Use: Never used  Substance and Sexual Activity   Alcohol use: No   Drug use: No   Sexual activity: Not on file  Other Topics Concern   Not on file  Social History Narrative   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    She is active and involved with the farm.   Social Determinants of Health   Financial Resource Strain: Not on file  Food Insecurity: Not on file  Transportation Needs: Not on file  Physical Activity: Not on file  Stress: Not on file  Social Connections: Not on file  Intimate Partner Violence: Not on file    Physical Exam: Today's Vitals   06/21/21 0938  BP: 128/66  Pulse: 63  Temp: 97.9 F (36.6 C)  TempSrc: Temporal  SpO2: 98%  Weight: 217 lb (98.4 kg)  Height: 5\' 2"  (1.575 m)   Body mass index is 39.69 kg/m. GEN: NAD EYE: Sclerae anicteric ENT: MMM CV: Non-tachycardic GI: Soft, NT/ND NEURO:  Alert & Oriented x 3  Lab Results: No  results for input(s): WBC, HGB, HCT, PLT in the last 72 hours. BMET No results for input(s): NA, K, CL, CO2, GLUCOSE, BUN, CREATININE, CALCIUM in the last 72 hours. LFT No results for input(s): PROT, ALBUMIN, AST, ALT, ALKPHOS, BILITOT, BILIDIR, IBILI in the last 72 hours. PT/INR No results for input(s): LABPROT, INR in the last 72 hours.   Impression / Plan: This is a 60 y.o.female who presents for colonoscopy for screening.  The risks and benefits of endoscopic evaluation/treatment were discussed with the patient and/or family; these include but are not limited to the risk of perforation, infection, bleeding, missed lesions, lack of diagnosis, severe illness requiring hospitalization, as well as anesthesia and sedation related illnesses.  The patient's history has been reviewed, patient examined, no change in status, and deemed stable for procedure.  The patient and/or family is agreeable to proceed.    Justice Britain, MD [facility] Advanced [facility]oscopy Office # [phone]

## 2021-06-21 NOTE — Op Note (Signed)
[place] Patient Name: Harmani Neto Procedure Date: 06/21/2021 10:17 AM MRN: [id number] Endoscopist: Justice Britain , MD Age: 60 Referring MD:  Date of Birth: [birth year] Gender: Female Account #: [id number] Procedure:                Colonoscopy Indications:              Screening in patient at increased risk: Colorectal                            cancer in brother before age 57 Medicines:                Monitored Anesthesia Care Procedure:                Pre-Anesthesia Assessment:                           - Prior to the procedure, a History and Physical                            was performed, and patient medications and                            allergies were reviewed. The patient's tolerance of                            previous anesthesia was also reviewed. The risks                            and benefits of the procedure and the sedation                            options and risks were discussed with the patient.                            All questions were answered, and informed consent                            was obtained. Prior Anticoagulants: The patient has                            taken no previous anticoagulant or antiplatelet                            agents except for NSAID medication. ASA Grade                            Assessment: II - A patient with mild systemic                            disease. After reviewing the risks and benefits,                            the patient was deemed in satisfactory condition to  undergo the procedure.                           After obtaining informed consent, the colonoscope                            was passed under direct vision. Throughout the                            procedure, the patient's blood pressure, pulse, and                            oxygen saturations were monitored continuously. The                            CF [id number] #[id number] was introduced through the  anus                            and advanced to the the cecum, identified by                            appendiceal orifice and ileocecal valve. The                            colonoscopy was somewhat difficult due to a                            redundant colon. Successful completion of the                            procedure was aided by changing the patient's                            position, using manual pressure, straightening and                            shortening the scope to obtain bowel loop reduction                            and using scope torsion. The patient tolerated the                            procedure. The quality of the bowel preparation was                            good. The ileocecal valve, appendiceal orifice, and                            rectum were photographed. Scope In: 10:30:38 AM Scope Out: 10:43:09 AM Scope Withdrawal Time: 0 hours 9 minutes 7 seconds  Total Procedure Duration: 0 hours 12 minutes 31 seconds  Findings:                 The digital rectal exam findings include  hemorrhoids. Pertinent negatives include no                            palpable rectal lesions.                           The colon (entire examined portion) was                            significantly redundant.                           A 4 mm polyp was found in the ascending colon. The                            polyp was sessile. The polyp was removed with a                            cold snare. Resection and retrieval were complete.                           Many small and large-mouthed diverticula were found                            in the entire colon.                           Normal mucosa was found in the entire colon                            otherwise.                           Non-bleeding non-thrombosed internal hemorrhoids                            were found during retroflexion, during perianal                            exam  and during digital exam. The hemorrhoids were                            Grade II (internal hemorrhoids that prolapse but                            reduce spontaneously). Complications:            No immediate complications. Estimated Blood Loss:     Estimated blood loss was minimal. Impression:               - Hemorrhoids found on digital rectal exam.                           - Redundant colon.                           - One 4 mm polyp  in the ascending colon, removed                            with a cold snare. Resected and retrieved.                           - Diverticulosis in the entire examined colon.                           - Normal mucosa in the entire examined colon                            otherwise.                           - Non-bleeding non-thrombosed internal hemorrhoids. Recommendation:           - The patient will be observed post-procedure,                            until all discharge criteria are met.                           - Discharge patient to home.                           - Patient has a contact number available for                            emergencies. The signs and symptoms of potential                            delayed complications were discussed with the                            patient. Return to normal activities tomorrow.                            Written discharge instructions were provided to the                            patient.                           - High fiber diet.                           - Use FiberCon 1-2 tablets PO daily.                           - Continue present medications.                           - Await pathology results.                           - Repeat colonoscopy in 5 years for  surveillance/screening due to family history of                            early colon cancer.                           - The findings and recommendations were discussed                            with  the patient.                           - The findings and recommendations were discussed                            with the patient's family. Justice Britain, MD 06/21/2021 10:48:51 AM

## 2021-06-21 NOTE — Progress Notes (Signed)
VS- Raven Mendoza  Pt's states no medical or surgical changes since previsit or office visit.  

## 2021-06-23 ENCOUNTER — Encounter: Payer: Self-pay | Admitting: Gastroenterology

## 2021-06-23 ENCOUNTER — Telehealth: Payer: Self-pay

## 2021-06-23 NOTE — Telephone Encounter (Signed)
  Follow up Call-  Call back number 06/21/2021  Post procedure Call Back phone  # [phone]  Permission to leave phone message Yes  Some recent data might be hidden     Patient questions:  Do you have a fever, pain , or abdominal swelling? No. Pain Score  0 *  Have you tolerated food without any problems? Yes.    Have you been able to return to your normal activities? Yes.    Do you have any questions about your discharge instructions: Diet   No. Medications  No. Follow up visit  No.  Do you have questions or concerns about your Care? No.  Actions: * If pain score is 4 or above: No action needed, pain <4.

## 2021-06-23 NOTE — Telephone Encounter (Signed)
Left message on follow up call. 

## 2021-08-11 ENCOUNTER — Other Ambulatory Visit: Payer: Self-pay | Admitting: "Endocrinology

## 2021-08-11 DIAGNOSIS — E890 Postprocedural hypothyroidism: Secondary | ICD-10-CM

## 2021-12-15 ENCOUNTER — Other Ambulatory Visit: Payer: Self-pay | Admitting: Internal Medicine

## 2021-12-15 DIAGNOSIS — M159 Polyosteoarthritis, unspecified: Secondary | ICD-10-CM

## 2022-01-24 ENCOUNTER — Other Ambulatory Visit: Payer: Self-pay | Admitting: "Endocrinology

## 2022-01-24 DIAGNOSIS — E890 Postprocedural hypothyroidism: Secondary | ICD-10-CM

## 2022-01-24 DIAGNOSIS — E559 Vitamin D deficiency, unspecified: Secondary | ICD-10-CM

## 2022-01-25 LAB — TSH: TSH: 0.45 u[IU]/mL (ref 0.450–4.500)

## 2022-01-25 LAB — T4, FREE: Free T4: 1.47 ng/dL (ref 0.82–1.77)

## 2022-01-25 LAB — VITAMIN D 25 HYDROXY (VIT D DEFICIENCY, FRACTURES): Vit D, 25-Hydroxy: 55.3 ng/mL (ref 30.0–100.0)

## 2022-01-31 ENCOUNTER — Encounter: Payer: Self-pay | Admitting: "Endocrinology

## 2022-01-31 ENCOUNTER — Ambulatory Visit (INDEPENDENT_AMBULATORY_CARE_PROVIDER_SITE_OTHER): Payer: 59 | Admitting: "Endocrinology

## 2022-01-31 VITALS — BP 134/78 | HR 68 | Ht 62.0 in | Wt 217.6 lb

## 2022-01-31 DIAGNOSIS — E890 Postprocedural hypothyroidism: Secondary | ICD-10-CM | POA: Diagnosis not present

## 2022-01-31 DIAGNOSIS — E559 Vitamin D deficiency, unspecified: Secondary | ICD-10-CM

## 2022-01-31 NOTE — Progress Notes (Signed)
?01/31/2022 ? ?Endocrinology follow-up note ? ? ?Subjective:  ? ? Patient ID: Raven Mendoza, female    DOB: 08-31-1961, PCP Isaac Bliss, Rayford Halsted, MD ? ? ?Past Medical History:  ?Diagnosis Date  ? Allergy to bee sting 04/20/2008  ? Anaphylaxis  ? Arthritis   ? Hyperthyroidism   ? Hypothyroidism   ? Obesity (BMI 35.0-39.9 without comorbidity)   ? Osteoarthritis   ? Vitamin D deficiency   ? ?Past Surgical History:  ?Procedure Laterality Date  ? c sections  1987 and 1990  ? COLONOSCOPY  03/24/2004  ? Dr.Johnson normal excellent prep  ? KNEE CLOSED REDUCTION  03/26/2012  ? Procedure: CLOSED MANIPULATION KNEE;  Surgeon: Mauri Pole, MD;  Location: [facility];  Service: Orthopedics;  Laterality: Left;  ? radioactive iodine tx for thyroid  4 yrs ago,and  5 yrs ago  ? REVISION TOTAL HIP ARTHROPLASTY Left 2019  ? TOTAL HIP ARTHROPLASTY  10/24/2011  ? right  ? TOTAL KNEE ARTHROPLASTY  01/16/2012  ? left  ? TOTAL KNEE ARTHROPLASTY  03/26/2012  ? Procedure: TOTAL KNEE ARTHROPLASTY;  Surgeon: Mauri Pole, MD;  Location: [facility];  Service: Orthopedics;  Laterality: Right;  ? ?Social History  ? ?Socioeconomic History  ? Marital status: Married  ?  Spouse name: Not on file  ? Number of children: Not on file  ? Years of education: Not on file  ? Highest education level: Not on file  ?Occupational History  ? Not on file  ?Tobacco Use  ? Smoking status: Never  ? Smokeless tobacco: Never  ?Vaping Use  ? Vaping Use: Never used  ?Substance and Sexual Activity  ? Alcohol use: No  ? Drug use: No  ? Sexual activity: Not on file  ?Other Topics Concern  ? Not on file  ?Social History Narrative  ? 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   ? She is active and involved with the farm.  ? ?Social Determinants of Health  ? ?Financial Resource Strain: Not on file  ?Food Insecurity: Not on file  ?Transportation Needs: Not on file  ?Physical Activity: Not on file  ?Stress: Not on file  ?Social Connections: Not on file  ? ?Outpatient Encounter  Medications as of 01/31/2022  ?Medication Sig  ? Cholecalciferol (VITAMIN D) 50 MCG (2000 UT) CAPS Take 2,000 Units by mouth daily with breakfast.  ? levothyroxine (SYNTHROID) 125 MCG tablet TAKE 1 TABLET BY MOUTH EVERY DAY BEFORE BREAKFAST  ? meloxicam (MOBIC) 15 MG tablet TAKE 1 TABLET(15 MG) BY MOUTH DAILY  ? [DISCONTINUED] Cholecalciferol (VITAMIN D3) 125 MCG (5000 UT) CAPS Take 1 capsule (5,000 Units total) by mouth daily. (Patient taking differently: Take 10,000 Units by mouth daily.)  ? ?No facility-administered encounter medications on file as of 01/31/2022.  ? ?ALLERGIES: ?Allergies  ?Allergen Reactions  ? Penicillins Swelling  ?  Pt reports remembers hand swelling, but not sure what else swelled up. Reports reaction was as a child.  ? ? ?VACCINATION STATUS: ?Immunization History  ?Administered Date(s) Administered  ? Influenza,inj,Quad PF,6+ Mos 08/01/2019, 05/17/2021  ? Td 08/12/2002  ? Tdap 12/03/2013  ? Zoster Recombinat (Shingrix) 02/11/2021, 05/17/2021  ? ? ?HPI ? ?61 year old female patient with medical history as above. 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 ? ?- Review of her medical records show that she did receive I-131 therapy on 2 occasions (18.2 mCi on 09/19/2005 and 26.1 mCi on 04/04/2006 due to refractory Graves' disease). ?- She was  started on thyroid hormone replacements which was adjusted several times over the years.  She is currently on levothyroxine 125 mcg p.o. daily before breakfast.   ?Her previsit labs are consistent with appropriate  replacement.    ? She reports compliance to this medication. She has no new coplaints. ? ? She denies heat nor cold intolerance. She denies dysphagia, shortness of breath, voice change. ?She has significant family history of various thyroid dysfunctions. She denies any family history of thyroid cancer. ?-She has steady weight since last visit.   ? ? ?Review of Systems ?Limited as above. ? ?Objective:  ?  ?BP 134/78   Pulse 68   Ht  '5\' 2"'$  (1.575 m)   Wt 217 lb 9.6 oz (98.7 kg)   LMP 10/23/2008   BMI 39.80 kg/m?   ?Wt Readings from Last 3 Encounters:  ?01/31/22 217 lb 9.6 oz (98.7 kg)  ?06/21/21 217 lb (98.4 kg)  ?06/06/21 217 lb (98.4 kg)  ?  ?Physical Exam ? ? ? ?CMP ( most recent) ?CMP  ?   ?Component Value Date/Time  ? NA 137 05/17/2021 0805  ? K 4.1 05/17/2021 0805  ? CL 103 05/17/2021 0805  ? CO2 26 05/17/2021 0805  ? GLUCOSE 89 05/17/2021 0805  ? BUN 24 (H) 05/17/2021 0805  ? CREATININE 0.60 05/17/2021 0805  ? CREATININE 0.67 08/01/2019 1431  ? CALCIUM 9.7 05/17/2021 0805  ? PROT 7.4 05/17/2021 0805  ? ALBUMIN 4.3 05/17/2021 0805  ? AST 18 05/17/2021 0805  ? ALT 15 05/17/2021 0805  ? ALKPHOS 85 05/17/2021 0805  ? BILITOT 0.7 05/17/2021 0805  ? GFRNONAA 85 11/10/2016 0822  ? GFRAA >89 11/10/2016 9528  ? ? ?Lipid Panel  ?   ?Component Value Date/Time  ? CHOL 205 (H) 05/17/2021 0805  ? TRIG 61.0 05/17/2021 0805  ? HDL 81.60 05/17/2021 0805  ? CHOLHDL 3 05/17/2021 0805  ? VLDL 12.2 05/17/2021 0805  ? [place] 111 (H) 05/17/2021 0805  ? ?Recent Results (from the past 2160 hour(s))  ?TSH     Status: None  ? Collection Time: 01/24/22  2:45 PM  ?Result Value Ref Range  ? TSH 0.450 0.450 - 4.500 uIU/mL  ?T4, Free     Status: None  ? Collection Time: 01/24/22  2:45 PM  ?Result Value Ref Range  ? Free T4 1.47 0.82 - 1.77 ng/dL  ?VITAMIN D 25 Hydroxy (Vit-D Deficiency, Fractures)     Status: None  ? Collection Time: 01/24/22  2:45 PM  ?Result Value Ref Range  ? Vit D, 25-Hydroxy 55.3 30.0 - 100.0 ng/mL  ?  Comment: Vitamin D deficiency has been defined by the Institute of ?Medicine and an Endocrine Society practice guideline as a ?level of serum 25-OH vitamin D less than 20 ng/mL (1,2). ?The Endocrine Society went on to further define vitamin D ?insufficiency as a level between 21 and 29 ng/mL (2). ?1. IOM (Institute of Medicine). 2010. Dietary reference ?   intakes for calcium and D. [place]: The ?   [employer]. ?2. Holick MF,  Binkley [place], Bischoff-Ferrari HA, et al. ?   Evaluation, treatment, and prevention of vitamin D ?   deficiency: an Endocrine Society clinical practice ?   guideline. JCEM. 2011 Jul; 96(7):1911-30. ?  ? ? ? ?Assessment & Plan:  ? ?1. 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  Her previsit thyroid function tests are consistent  with appropriate replacement.  She is advised to continue Synthroid 125 mcg p.o. daily before breakfast.   ? ? - We discussed about the correct intake of her thyroid hormone, on empty stomach at fasting, with water, separated by at least 30 minutes from breakfast and other medications,  and separated by more than 4 hours from calcium, iron, multivitamins, acid reflux medications (PPIs). ?-Patient is made aware of the fact that thyroid hormone replacement is needed for life, dose to be adjusted by periodic monitoring of thyroid function tests. ? ? ?- She does not have clinical goiter, no need for thyroid imaging at this time. ? ? ?2. Vitamin D deficiency ?-Her vitamin D is replete at 46.  She is advised to lower her vitamin D supplement to 2000 units daily.   ? ? ?- I advised patient to maintain close follow up with Isaac Bliss, Rayford Halsted, MD for primary care needs. ? ? ? ?I spent 21 minutes in the care of the patient today including review of labs from Thyroid Function, CMP, and other relevant labs ; imaging/biopsy records (current and previous including abstractions from other facilities); face-to-face time discussing  her lab results and symptoms, medications doses, her options of short and long term treatment based on the latest standards of care / guidelines;   and documenting the encounter. ? ?Raven Mendoza  participated in the discussions, expressed understanding, and voiced agreement with the above plans.  All questions were answered to her satisfaction. she is encouraged to  contact clinic should she have any questions or concerns prior to her return visit. ? ? ? ?Follow up plan: ?Return in about 6 months (around 08/03/2022) for F/U with Pre-visit Labs. ? ?Glade Lloyd, MD ?Phone: [phone]

## 2022-02-04 ENCOUNTER — Other Ambulatory Visit: Payer: Self-pay | Admitting: "Endocrinology

## 2022-02-04 DIAGNOSIS — E890 Postprocedural hypothyroidism: Secondary | ICD-10-CM

## 2022-03-02 ENCOUNTER — Other Ambulatory Visit: Payer: Self-pay

## 2022-03-02 DIAGNOSIS — E890 Postprocedural hypothyroidism: Secondary | ICD-10-CM

## 2022-03-02 MED ORDER — LEVOTHYROXINE SODIUM 125 MCG PO TABS: ORAL_TABLET | ORAL | 1 refills | Status: DC

## 2022-03-07 ENCOUNTER — Other Ambulatory Visit: Payer: Self-pay | Admitting: *Deleted

## 2022-03-07 DIAGNOSIS — M159 Polyosteoarthritis, unspecified: Secondary | ICD-10-CM

## 2022-03-07 MED ORDER — MELOXICAM 15 MG PO TABS: 15.0000 mg | ORAL_TABLET | Freq: Every day | ORAL | 0 refills | Status: DC

## 2022-05-18 ENCOUNTER — Encounter: Payer: 59 | Admitting: Internal Medicine

## 2022-06-07 ENCOUNTER — Other Ambulatory Visit: Payer: Self-pay | Admitting: Internal Medicine

## 2022-06-07 DIAGNOSIS — Z1231 Encounter for screening mammogram for malignant neoplasm of breast: Secondary | ICD-10-CM

## 2022-07-04 ENCOUNTER — Ambulatory Visit: Payer: Self-pay

## 2022-07-13 ENCOUNTER — Encounter: Payer: 59 | Admitting: Internal Medicine

## 2022-07-17 ENCOUNTER — Encounter: Payer: Self-pay | Admitting: Internal Medicine

## 2022-07-17 ENCOUNTER — Other Ambulatory Visit: Payer: Self-pay | Admitting: Internal Medicine

## 2022-07-17 ENCOUNTER — Ambulatory Visit (INDEPENDENT_AMBULATORY_CARE_PROVIDER_SITE_OTHER): Payer: Commercial Managed Care - HMO | Admitting: Internal Medicine

## 2022-07-17 VITALS — BP 159/88 | HR 67 | Temp 97.4°F | Ht 62.5 in | Wt 213.8 lb

## 2022-07-17 DIAGNOSIS — M159 Polyosteoarthritis, unspecified: Secondary | ICD-10-CM

## 2022-07-17 DIAGNOSIS — Z23 Encounter for immunization: Secondary | ICD-10-CM | POA: Diagnosis not present

## 2022-07-17 DIAGNOSIS — Z0000 Encounter for general adult medical examination without abnormal findings: Secondary | ICD-10-CM

## 2022-07-17 DIAGNOSIS — E890 Postprocedural hypothyroidism: Secondary | ICD-10-CM

## 2022-07-17 DIAGNOSIS — E782 Mixed hyperlipidemia: Secondary | ICD-10-CM

## 2022-07-17 DIAGNOSIS — R7302 Impaired glucose tolerance (oral): Secondary | ICD-10-CM | POA: Diagnosis not present

## 2022-07-17 DIAGNOSIS — E669 Obesity, unspecified: Secondary | ICD-10-CM

## 2022-07-17 DIAGNOSIS — M150 Primary generalized (osteo)arthritis: Secondary | ICD-10-CM

## 2022-07-17 DIAGNOSIS — R030 Elevated blood-pressure reading, without diagnosis of hypertension: Secondary | ICD-10-CM

## 2022-07-17 DIAGNOSIS — E559 Vitamin D deficiency, unspecified: Secondary | ICD-10-CM

## 2022-07-17 LAB — CBC WITH DIFFERENTIAL/PLATELET
Basophils Absolute: 0.1 10*3/uL (ref 0.0–0.1)
Basophils Relative: 0.9 % (ref 0.0–3.0)
Eosinophils Absolute: 0.2 10*3/uL (ref 0.0–0.7)
Eosinophils Relative: 3.7 % (ref 0.0–5.0)
HCT: 42.6 % (ref 36.0–46.0)
Hemoglobin: 14.4 g/dL (ref 12.0–15.0)
Lymphocytes Relative: 28.3 % (ref 12.0–46.0)
Lymphs Abs: 1.8 10*3/uL (ref 0.7–4.0)
MCHC: 33.9 g/dL (ref 30.0–36.0)
MCV: 87.3 fl (ref 78.0–100.0)
Monocytes Absolute: 0.6 10*3/uL (ref 0.1–1.0)
Monocytes Relative: 9.7 % (ref 3.0–12.0)
Neutro Abs: 3.6 10*3/uL (ref 1.4–7.7)
Neutrophils Relative %: 57.4 % (ref 43.0–77.0)
Platelets: 287 10*3/uL (ref 150.0–400.0)
RBC: 4.88 Mil/uL (ref 3.87–5.11)
RDW: 13.7 % (ref 11.5–15.5)
WBC: 6.2 10*3/uL (ref 4.0–10.5)

## 2022-07-17 LAB — LIPID PANEL
Cholesterol: 202 mg/dL — ABNORMAL HIGH (ref 0–200)
HDL: 74.8 mg/dL (ref >39.00)
LDL Cholesterol: 114 mg/dL — ABNORMAL HIGH (ref 0–99)
NonHDL: 126.92
Total CHOL/HDL Ratio: 3
Triglycerides: 65 mg/dL (ref 0.0–149.0)
VLDL: 13 mg/dL (ref 0.0–40.0)

## 2022-07-17 LAB — HEMOGLOBIN A1C: Hgb A1c MFr Bld: 6.5 % (ref 4.6–6.5)

## 2022-07-17 LAB — COMPREHENSIVE METABOLIC PANEL
ALT: 15 U/L (ref 0–35)
AST: 19 U/L (ref 0–37)
Albumin: 4.4 g/dL (ref 3.5–5.2)
Alkaline Phosphatase: 76 U/L (ref 39–117)
BUN: 23 mg/dL (ref 6–23)
CO2: 25 mEq/L (ref 19–32)
Calcium: 9.6 mg/dL (ref 8.4–10.5)
Chloride: 102 mEq/L (ref 96–112)
Creatinine, Ser: 0.66 mg/dL (ref 0.40–1.20)
GFR: 93.27 mL/min (ref >60.00)
Glucose, Bld: 117 mg/dL — ABNORMAL HIGH (ref 70–99)
Potassium: 4 mEq/L (ref 3.5–5.1)
Sodium: 137 mEq/L (ref 135–145)
Total Bilirubin: 0.7 mg/dL (ref 0.2–1.2)
Total Protein: 7.7 g/dL (ref 6.0–8.3)

## 2022-07-17 LAB — VITAMIN D 25 HYDROXY (VIT D DEFICIENCY, FRACTURES): VITD: 31.69 ng/mL (ref 30.00–100.00)

## 2022-07-17 MED ORDER — MELOXICAM 15 MG PO TABS: 15.0000 mg | ORAL_TABLET | Freq: Every day | ORAL | 1 refills | Status: DC

## 2022-07-17 MED ORDER — ATORVASTATIN CALCIUM 40 MG PO TABS: 40.0000 mg | ORAL_TABLET | Freq: Every day | ORAL | 1 refills | Status: DC

## 2022-07-17 NOTE — Patient Instructions (Signed)
-  Nice seeing you today!!  -Lab work today; will notify you once results are available.  -Flu vaccine today.  -Check BP at home and follow up in 3 months.

## 2022-07-17 NOTE — Progress Notes (Signed)
Established Patient Office Visit     CC/Reason for Visit: Annual preventive exam  HPI: [name] is a 61 y.o. female who is coming in today for the above mentioned reasons. 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  She has no acute concerns or complaints today.  She has routine eye care, scheduled for dental examination soon.  Requesting flu vaccine today.  Blood pressure is noted to be elevated on 2 separate measurements.   Past Medical/Surgical History: Past Medical History:  Diagnosis Date   Allergy to bee sting 04/20/2008   Anaphylaxis   Arthritis    Hyperthyroidism    Hypothyroidism    Obesity (BMI 35.0-39.9 without comorbidity)    Osteoarthritis    Vitamin D deficiency     Past Surgical History:  Procedure Laterality Date   c sections  1987 and 1990   COLONOSCOPY  03/24/2004   Dr.Johnson normal excellent prep   KNEE CLOSED REDUCTION  03/26/2012   Procedure: CLOSED MANIPULATION KNEE;  Surgeon: Mauri Pole, MD;  Location: [facility];  Service: Orthopedics;  Laterality: Left;   radioactive iodine tx for thyroid  4 yrs ago,and  5 yrs ago   Circle Left 2019   TOTAL HIP ARTHROPLASTY  10/24/2011   right   TOTAL KNEE ARTHROPLASTY  01/16/2012   left   TOTAL KNEE ARTHROPLASTY  03/26/2012   Procedure: TOTAL KNEE ARTHROPLASTY;  Surgeon: Mauri Pole, MD;  Location: [facility];  Service: Orthopedics;  Laterality: Right;    Social History:  reports that she has never smoked. She has never used smokeless tobacco. She reports that she does not drink alcohol and does not use drugs.  Allergies: Allergies  Allergen Reactions   Penicillins Swelling    Pt reports remembers hand swelling, but not sure what else swelled up. Reports reaction was as a child.    Family History:  Family History   Problem Relation Age of Onset   Colon cancer Brother 53   Cancer Brother 12       colon cancer   Diabetes Paternal Grandmother    Breast cancer Neg Hx    Colon polyps Neg Hx    Esophageal cancer Neg Hx    Rectal cancer Neg Hx    Stomach cancer Neg Hx      Current Outpatient Medications:    Cholecalciferol (VITAMIN D) 50 MCG (2000 UT) CAPS, Take 2,000 Units by mouth daily with breakfast., Disp: , Rfl:    levothyroxine (SYNTHROID) 125 MCG tablet, TAKE 1 TABLET DAILY BY MOUTH BEFORE BREAKFAST, Disp: 90 tablet, Rfl: 1   meloxicam (MOBIC) 15 MG tablet, Take 1 tablet (15 mg total) by mouth daily. Please schedule a physical for more refills, Disp: 90 tablet, Rfl: 1  Review of Systems:  Constitutional: Denies fever, chills, diaphoresis, appetite change and fatigue.  HEENT: Denies photophobia, eye pain, redness, hearing loss, ear pain, congestion, sore throat, rhinorrhea, sneezing, mouth sores, trouble swallowing, neck pain, neck stiffness and tinnitus.   Respiratory: Denies SOB, DOE, cough, chest tightness,  and wheezing.   Cardiovascular: Denies chest pain, palpitations and leg swelling.  Gastrointestinal: Denies nausea, vomiting, abdominal pain, diarrhea, constipation, blood in stool and abdominal distention.  Genitourinary: Denies dysuria, urgency, frequency, hematuria, flank pain and difficulty urinating.  Endocrine: Denies: hot or cold intolerance, sweats, changes in hair or nails, polyuria, polydipsia. Musculoskeletal: Denies myalgias,  back pain, joint swelling, arthralgias and gait problem.  Skin: Denies pallor, rash and wound.  Neurological: Denies dizziness, seizures, syncope, weakness, light-headedness, numbness and headaches.  Hematological: Denies adenopathy. Easy bruising, personal or family bleeding history  Psychiatric/Behavioral: Denies suicidal ideation, mood changes, confusion, nervousness, sleep disturbance and agitation    Physical Exam: Vitals:   07/17/22 0728  07/17/22 0732  BP: (!) 140/80 (!) 159/88  Pulse: 67   Temp: (!) 97.4 F (36.3 C)   TempSrc: Oral   SpO2: 97%   Weight: 213 lb 12.8 oz (97 kg)   Height: 5' 2.5" (1.588 m)     Body mass index is 38.48 kg/m.   Constitutional: NAD, calm, comfortable Eyes: PERRL, lids and conjunctivae normal, wears corrective lenses ENMT: Mucous membranes are moist. Posterior pharynx clear of any exudate or lesions. Normal dentition. Tympanic membrane is pearly white, no erythema or bulging. Neck: normal, supple, no masses, no thyromegaly Respiratory: clear to auscultation bilaterally, no wheezing, no crackles. Normal respiratory effort. No accessory muscle use.  Cardiovascular: Regular rate and rhythm, no murmurs / rubs / gallops. No extremity edema. 2+ pedal pulses. No carotid bruits.  Abdomen: no tenderness, no masses palpated. No hepatosplenomegaly. Bowel sounds positive.  Musculoskeletal: no clubbing / cyanosis. No joint deformity upper and lower extremities. Good ROM, no contractures. Normal muscle tone.  Skin: no rashes, lesions, ulcers. No induration Neurologic: CN 2-12 grossly intact. Sensation intact, DTR normal. Strength 5/5 in all 4.  Psychiatric: Normal judgment and insight. Alert and oriented x 3. Normal mood.   [place] Visit from 07/17/2022 in [facility] [place]  PHQ-9 Total Score 0         Impression and Plan:  Encounter for preventive health examination  Primary osteoarthritis involving multiple joints - Plan: meloxicam (MOBIC) 15 MG tablet  Elevated BP without diagnosis of hypertension  Need for influenza vaccination  IGT (impaired glucose tolerance) - Plan: Hemoglobin A1c  Mixed hyperlipidemia - Plan: CBC with Differential/Platelet, Comprehensive metabolic panel, Lipid panel  Postablative hypothyroidism  Obesity (BMI 35.0-39.9 without comorbidity)  Vitamin D deficiency - Plan: VITAMIN D 25 Hydroxy (Vit-D Deficiency,  Fractures)    -Recommend routine eye and dental care. -Immunizations: Flu vaccine, is also due for COVID but otherwise immunizations are up-to-date -Healthy lifestyle discussed in detail. -Labs to be updated today. -Colon cancer screening: 06/2021 -Breast cancer screening: 04/2021, has appointment for November -Cervical cancer screening: 04/2021 -Lung cancer screening: Not applicable -Prostate cancer screening: Not applicable -DEXA: Not applicable  -Blood pressure is elevated today on 2 separate measurements.  She will do ambulatory blood pressure measurements and return in 3 months for follow-up.    Patient Instructions  -Nice seeing you today!!  -Lab work today; will notify you once results are available.  -Flu vaccine today.  -Check BP at home and follow up in 3 months.      Lelon Frohlich, MD [facility]

## 2022-07-24 ENCOUNTER — Telehealth: Payer: Self-pay | Admitting: "Endocrinology

## 2022-07-24 NOTE — Telephone Encounter (Signed)
New message    The patient had recent lab drawn A1C -- results in [facility]. Please advise if AIC will need to be repeat.

## 2022-07-24 NOTE — Telephone Encounter (Signed)
Spoke with pt, advised her she would not need a repeat HgbA1c before her appointment. Discussed with pt her insurance would not cover a repeat of test until 3 months from last time it was drawn. Understanding voiced.

## 2022-07-29 LAB — T4, FREE: Free T4: 1.56 ng/dL (ref 0.82–1.77)

## 2022-07-29 LAB — TSH: TSH: 0.377 u[IU]/mL — ABNORMAL LOW (ref 0.450–4.500)

## 2022-08-01 ENCOUNTER — Ambulatory Visit: Payer: Self-pay

## 2022-08-03 ENCOUNTER — Ambulatory Visit (INDEPENDENT_AMBULATORY_CARE_PROVIDER_SITE_OTHER): Payer: 59 | Admitting: "Endocrinology

## 2022-08-03 ENCOUNTER — Encounter: Payer: Self-pay | Admitting: "Endocrinology

## 2022-08-03 VITALS — BP 136/82 | HR 72 | Ht 62.5 in | Wt 210.6 lb

## 2022-08-03 DIAGNOSIS — E782 Mixed hyperlipidemia: Secondary | ICD-10-CM

## 2022-08-03 DIAGNOSIS — E890 Postprocedural hypothyroidism: Secondary | ICD-10-CM

## 2022-08-03 MED ORDER — ATORVASTATIN CALCIUM 40 MG PO TABS: 40.0000 mg | ORAL_TABLET | Freq: Every day | ORAL | 1 refills | Status: DC

## 2022-08-03 MED ORDER — LEVOTHYROXINE SODIUM 112 MCG PO TABS: 112.0000 ug | ORAL_TABLET | Freq: Every day | ORAL | 1 refills | Status: DC

## 2022-08-03 NOTE — Progress Notes (Signed)
08/03/2022  Endocrinology follow-up note   Subjective:    Patient ID: Raven Mendoza, female    DOB: 03-28-1961, PCP Isaac Bliss, Rayford Halsted, MD   Past Medical History:  Diagnosis Date   Allergy to bee sting 04/20/2008   Anaphylaxis   Arthritis    Hyperthyroidism    Hypothyroidism    Obesity (BMI 35.0-39.9 without comorbidity)    Osteoarthritis    Vitamin D deficiency    Past Surgical History:  Procedure Laterality Date   c sections  1987 and 1990   COLONOSCOPY  03/24/2004   Dr.Johnson normal excellent prep   KNEE CLOSED REDUCTION  03/26/2012   Procedure: CLOSED MANIPULATION KNEE;  Surgeon: Mauri Pole, MD;  Location: [facility];  Service: Orthopedics;  Laterality: Left;   radioactive iodine tx for thyroid  4 yrs ago,and  5 yrs ago   [place] Left 2019   TOTAL HIP ARTHROPLASTY  10/24/2011   right   TOTAL KNEE ARTHROPLASTY  01/16/2012   left   TOTAL KNEE ARTHROPLASTY  03/26/2012   Procedure: TOTAL KNEE ARTHROPLASTY;  Surgeon: Mauri Pole, MD;  Location: [facility];  Service: Orthopedics;  Laterality: Right;   Social History   Socioeconomic History   Marital status: Married    Spouse name: Not on file   Number of children: Not on file   Years of education: Not on file   Highest education level: Not on file  Occupational History   Not on file  Tobacco Use   Smoking status: Never   Smokeless tobacco: Never  Vaping Use   Vaping Use: Never used  Substance and Sexual Activity   Alcohol use: No   Drug use: No   Sexual activity: Not on file  Other Topics Concern   Not on file  Social History Narrative   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    She is active and involved with the farm.   Social Determinants of Health   Financial Resource Strain: Not on file  Food Insecurity: Not on file  Transportation Needs: Not on file  Physical Activity: Not on file  Stress: Not on file  Social Connections: Not on file   Outpatient Encounter  Medications as of 08/03/2022  Medication Sig   atorvastatin (LIPITOR) 40 MG tablet Take 1 tablet (40 mg total) by mouth daily. (Patient not taking: Reported on 08/03/2022)   Cholecalciferol (VITAMIN D) 50 MCG (2000 UT) CAPS Take 2,000 Units by mouth daily with breakfast.   levothyroxine (SYNTHROID) 112 MCG tablet Take 1 tablet (112 mcg total) by mouth daily before breakfast. TAKE 1 TABLET DAILY BY MOUTH BEFORE BREAKFAST   meloxicam (MOBIC) 15 MG tablet Take 1 tablet (15 mg total) by mouth daily. Please schedule a physical for more refills   [DISCONTINUED] levothyroxine (SYNTHROID) 125 MCG tablet TAKE 1 TABLET DAILY BY MOUTH BEFORE BREAKFAST   No facility-administered encounter medications on file as of 08/03/2022.   ALLERGIES: Allergies  Allergen Reactions   Penicillins Swelling    Pt reports remembers hand swelling, but not sure what else swelled up. Reports reaction was as a child.    VACCINATION STATUS: Immunization History  Administered Date(s) Administered   Influenza,inj,Quad PF,6+ Mos 08/01/2019, 05/17/2021, 07/17/2022   Td 08/12/2002   Tdap 12/03/2013   Zoster Recombinat (Shingrix) 02/11/2021, 05/17/2021    HPI  61 year old female patient with medical history as above. 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  - Review of her  medical records show that she did receive I-131 therapy on 2 occasions (18.2 mCi on 09/19/2005 and 26.1 mCi on 04/04/2006 due to refractory Graves' disease). - She was started on thyroid hormone replacements which was adjusted several times over the years.  She is currently on levothyroxine 125 mcg p.o. daily before breakfast.   Her previsit labs are consistent with slight over-replacement.       She reports compliance to this medication. She has no new coplaints. Her labs also showed hyperlipidemia.  She denies heat nor cold intolerance. She denies dysphagia, shortness of breath, voice change. She has significant family history of  various thyroid dysfunctions. She denies any family history of thyroid cancer. -She has steady weight since last visit.     Review of Systems Limited as above.  Objective:    BP 136/82   Pulse 72   Ht 5' 2.5" (1.588 m)   Wt 210 lb 9.6 oz (95.5 kg)   LMP 10/23/2008   BMI 37.91 kg/m   Wt Readings from Last 3 Encounters:  08/03/22 210 lb 9.6 oz (95.5 kg)  07/17/22 213 lb 12.8 oz (97 kg)  01/31/22 217 lb 9.6 oz (98.7 kg)    Physical Exam    CMP ( most recent) CMP     Component Value Date/Time   NA 137 07/17/2022 0755   K 4.0 07/17/2022 0755   CL 102 07/17/2022 0755   CO2 25 07/17/2022 0755   GLUCOSE 117 (H) 07/17/2022 0755   BUN 23 07/17/2022 0755   CREATININE 0.66 07/17/2022 0755   CREATININE 0.67 08/01/2019 1431   CALCIUM 9.6 07/17/2022 0755   PROT 7.7 07/17/2022 0755   ALBUMIN 4.4 07/17/2022 0755   AST 19 07/17/2022 0755   ALT 15 07/17/2022 0755   ALKPHOS 76 07/17/2022 0755   BILITOT 0.7 07/17/2022 0755   GFRNONAA 85 11/10/2016 0822   GFRAA >89 11/10/2016 0822    Lipid Panel     Component Value Date/Time   CHOL 202 (H) 07/17/2022 0755   TRIG 65.0 07/17/2022 0755   HDL 74.80 07/17/2022 0755   CHOLHDL 3 07/17/2022 0755   VLDL 13.0 07/17/2022 0755   LDLCALC 114 (H) 07/17/2022 0755   Recent Results (from the past 2160 hour(s))  VITAMIN D 25 Hydroxy (Vit-D Deficiency, Fractures)     Status: None   Collection Time: 07/17/22  7:55 AM  Result Value Ref Range   VITD 31.69 30.00 - 100.00 ng/mL  Lipid panel     Status: Abnormal   Collection Time: 07/17/22  7:55 AM  Result Value Ref Range   Cholesterol 202 (H) 0 - 200 mg/dL    Comment: ATP III Classification       Desirable:  < 200 mg/dL               Borderline High:  200 - 239 mg/dL          High:  > = 240 mg/dL   Triglycerides 65.0 0.0 - 149.0 mg/dL    Comment: Normal:  <150 mg/dLBorderline High:  150 - 199 mg/dL   HDL 74.80 >39.00 mg/dL   VLDL 13.0 0.0 - 40.0 mg/dL   LDL Cholesterol 114 (H) 0 - 99 mg/dL    Total CHOL/HDL Ratio 3     Comment:                Men          Women1/2 Average Risk     3.4  3.3Average Risk          5.0          4.42X Average Risk          9.6          7.13X Average Risk          15.0          11.0                       NonHDL 126.92     Comment: NOTE:  Non-HDL goal should be 30 mg/dL higher than patient's LDL goal (i.e. LDL goal of < 70 mg/dL, would have non-HDL goal of < 100 mg/dL)  Hemoglobin A1c     Status: None   Collection Time: 07/17/22  7:55 AM  Result Value Ref Range   Hgb A1c MFr Bld 6.5 4.6 - 6.5 %    Comment: Glycemic Control Guidelines for People with Diabetes:Non Diabetic:  <6%Goal of Therapy: <7%Additional Action Suggested:  >8%   Comprehensive metabolic panel     Status: Abnormal   Collection Time: 07/17/22  7:55 AM  Result Value Ref Range   Sodium 137 135 - 145 mEq/L   Potassium 4.0 3.5 - 5.1 mEq/L   Chloride 102 96 - 112 mEq/L   CO2 25 19 - 32 mEq/L   Glucose, Bld 117 (H) 70 - 99 mg/dL   BUN 23 6 - 23 mg/dL   Creatinine, Ser 0.66 0.40 - 1.20 mg/dL   Total Bilirubin 0.7 0.2 - 1.2 mg/dL   Alkaline Phosphatase 76 39 - 117 U/L   AST 19 0 - 37 U/L   ALT 15 0 - 35 U/L   Total Protein 7.7 6.0 - 8.3 g/dL   Albumin 4.4 3.5 - 5.2 g/dL   GFR 93.27 >60.00 mL/min    Comment: Calculated using the CKD-EPI Creatinine Equation (2021)   Calcium 9.6 8.4 - 10.5 mg/dL  CBC with Differential/Platelet     Status: None   Collection Time: 07/17/22  7:55 AM  Result Value Ref Range   WBC 6.2 4.0 - 10.5 K/uL   RBC 4.88 3.87 - 5.11 Mil/uL   Hemoglobin 14.4 12.0 - 15.0 g/dL   HCT 42.6 36.0 - 46.0 %   MCV 87.3 78.0 - 100.0 fl   MCHC 33.9 30.0 - 36.0 g/dL   RDW 13.7 11.5 - 15.5 %   Platelets 287.0 150.0 - 400.0 K/uL   Neutrophils Relative % 57.4 43.0 - 77.0 %   Lymphocytes Relative 28.3 12.0 - 46.0 %   Monocytes Relative 9.7 3.0 - 12.0 %   Eosinophils Relative 3.7 0.0 - 5.0 %   Basophils Relative 0.9 0.0 - 3.0 %   Neutro Abs 3.6 1.4 - 7.7 K/uL   Lymphs  Abs 1.8 0.7 - 4.0 K/uL   Monocytes Absolute 0.6 0.1 - 1.0 K/uL   Eosinophils Absolute 0.2 0.0 - 0.7 K/uL   Basophils Absolute 0.1 0.0 - 0.1 K/uL  TSH     Status: Abnormal   Collection Time: 07/28/22 11:02 AM  Result Value Ref Range   TSH 0.377 (L) 0.450 - 4.500 uIU/mL  T4, free     Status: None   Collection Time: 07/28/22 11:02 AM  Result Value Ref Range   Free T4 1.56 0.82 - 1.77 ng/dL     Assessment & Plan:   1. 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  Her previsit thyroid function tests are consistent with slight over-replacement.  I discussed and lowered her Synthroid to 112 mcg p.o. daily before breakfast.      - We discussed about the correct intake of her thyroid hormone, on empty stomach at fasting, with water, separated by at least 30 minutes from breakfast and other medications,  and separated by more than 4 hours from calcium, iron, multivitamins, acid reflux medications (PPIs). -Patient is made aware of the fact that thyroid hormone replacement is needed for life, dose to be adjusted by periodic monitoring of thyroid function tests. - She does not have clinical goiter, no need for thyroid imaging at this time. 2. hyperlipidemia: I encouraged her to continue atorvastatin 40 mg p.o. nightly.  Side effects and precautions discussed with her.  Whole food plant-based diet was discussed briefly with her.    3. Vitamin D deficiency -Her vitamin D is replete at 54.  She is advised to lower her vitamin D supplement to 2000 units daily.     - I advised patient to maintain close follow up with Isaac Bliss, Rayford Halsted, MD for primary care needs.    I spent 21 minutes in the care of the patient today including review of labs from Thyroid Function, CMP, and other relevant labs ; imaging/biopsy records (current and previous including abstractions from other facilities);  face-to-face time discussing  her lab results and symptoms, medications doses, her options of short and long term treatment based on the latest standards of care / guidelines;   and documenting the encounter.  Audrea Muscat Wiebelhaus  participated in the discussions, expressed understanding, and voiced agreement with the above plans.  All questions were answered to her satisfaction. she is encouraged to contact clinic should she have any questions or concerns prior to her return visit.    Follow up plan: Return in about 6 months (around 02/01/2023) for Fasting Labs  in AM B4 8.  Glade Lloyd, MD Phone: [phone]  Fax: [phone]   08/03/2022, 3:41 PM

## 2022-09-04 ENCOUNTER — Other Ambulatory Visit: Payer: Self-pay

## 2022-09-04 DIAGNOSIS — E890 Postprocedural hypothyroidism: Secondary | ICD-10-CM

## 2022-09-04 MED ORDER — LEVOTHYROXINE SODIUM 112 MCG PO TABS: 112.0000 ug | ORAL_TABLET | Freq: Every day | ORAL | 1 refills | Status: DC

## 2022-09-06 ENCOUNTER — Other Ambulatory Visit: Payer: Self-pay | Admitting: *Deleted

## 2022-09-06 DIAGNOSIS — E782 Mixed hyperlipidemia: Secondary | ICD-10-CM

## 2022-09-06 DIAGNOSIS — M159 Polyosteoarthritis, unspecified: Secondary | ICD-10-CM

## 2022-09-06 MED ORDER — ATORVASTATIN CALCIUM 40 MG PO TABS: 40.0000 mg | ORAL_TABLET | Freq: Every day | ORAL | 1 refills | Status: DC

## 2022-09-06 MED ORDER — MELOXICAM 15 MG PO TABS: 15.0000 mg | ORAL_TABLET | Freq: Every day | ORAL | 1 refills | Status: DC

## 2022-09-20 ENCOUNTER — Ambulatory Visit
Admission: RE | Admit: 2022-09-20 | Discharge: 2022-09-20 | Disposition: A | Discharge status: Home / Self Care | Payer: Commercial Managed Care - HMO | Source: Ambulatory Visit | Attending: Internal Medicine | Admitting: Internal Medicine

## 2022-09-20 DIAGNOSIS — Z1231 Encounter for screening mammogram for malignant neoplasm of breast: Secondary | ICD-10-CM

## 2022-10-17 ENCOUNTER — Ambulatory Visit: Payer: Commercial Managed Care - HMO | Admitting: Internal Medicine

## 2022-10-19 DIAGNOSIS — Z419 Encounter for procedure for purposes other than remedying health state, unspecified: Secondary | ICD-10-CM | POA: Diagnosis not present

## 2022-10-24 ENCOUNTER — Ambulatory Visit: Payer: Commercial Managed Care - HMO | Admitting: Internal Medicine

## 2022-11-09 IMAGING — MG DIGITAL SCREENING BILAT W/ CAD
5 series · 5 of 5 positions shown · non-contrast
Comparison: Previous exam(s).

CLINICAL DATA: Screening.

EXAM:
DIGITAL SCREENING BILATERAL MAMMOGRAM WITH CAD
TECHNIQUE: Bilateral screening digital craniocaudal and mediolateral oblique
mammograms were obtained. The images were evaluated with
computer-aided detection.

[R MLO (1 of 2)]
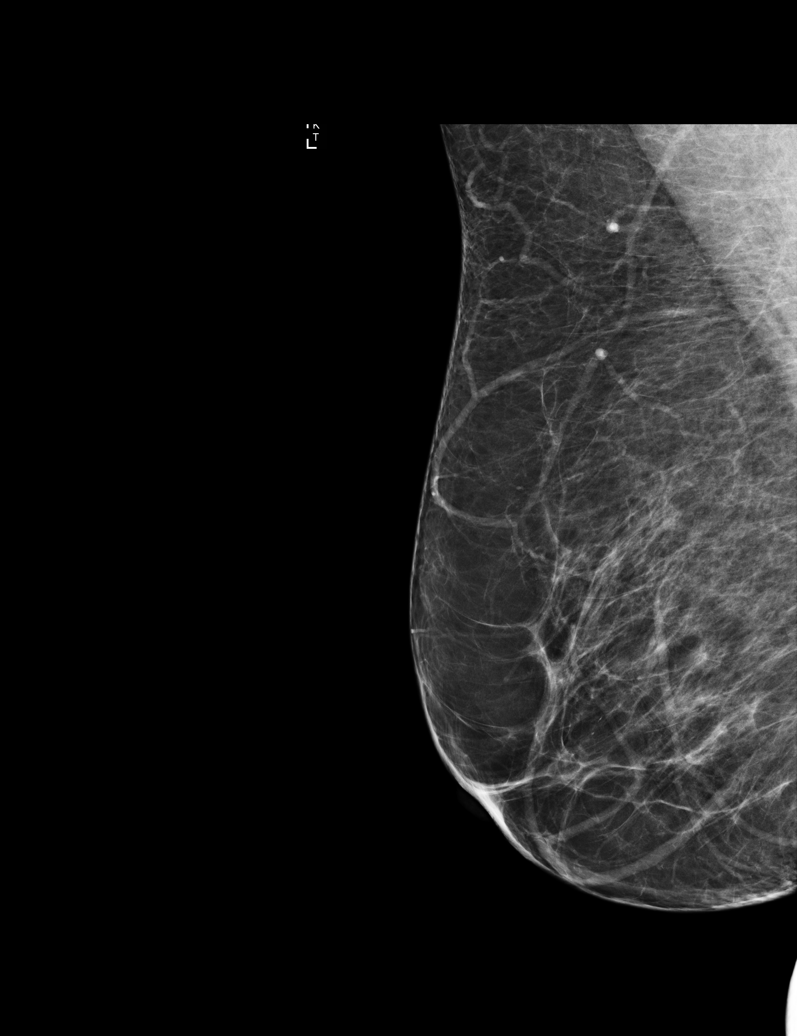

[R MLO (2 of 2)]
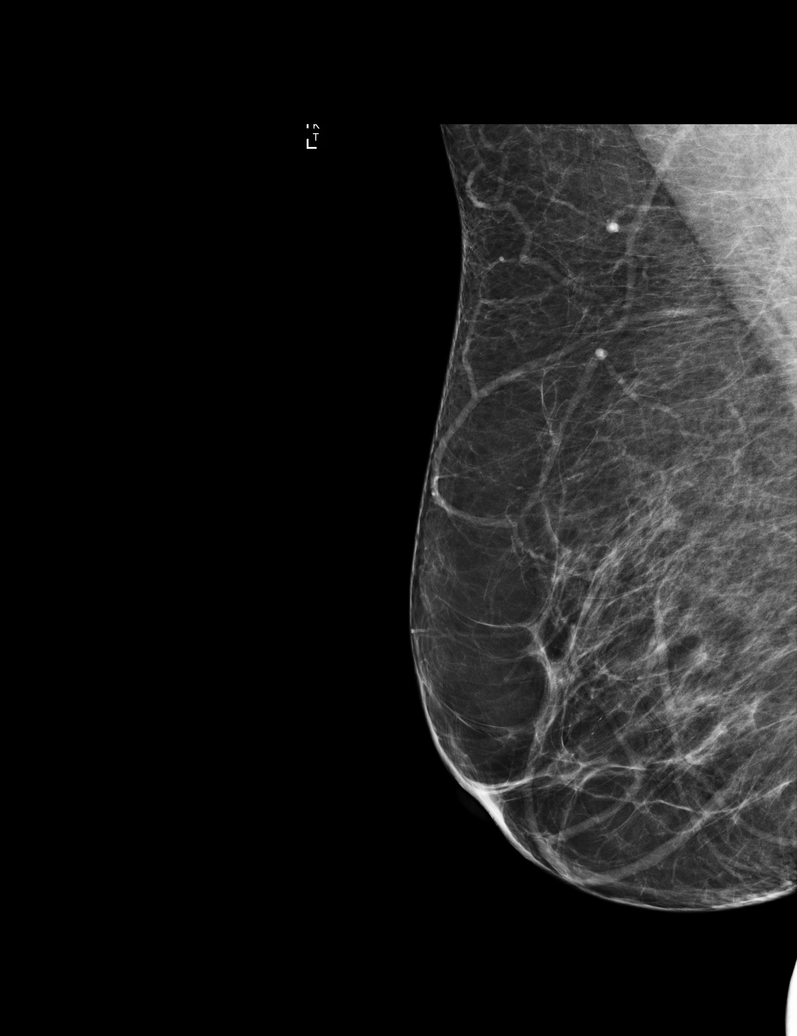

[L CC]
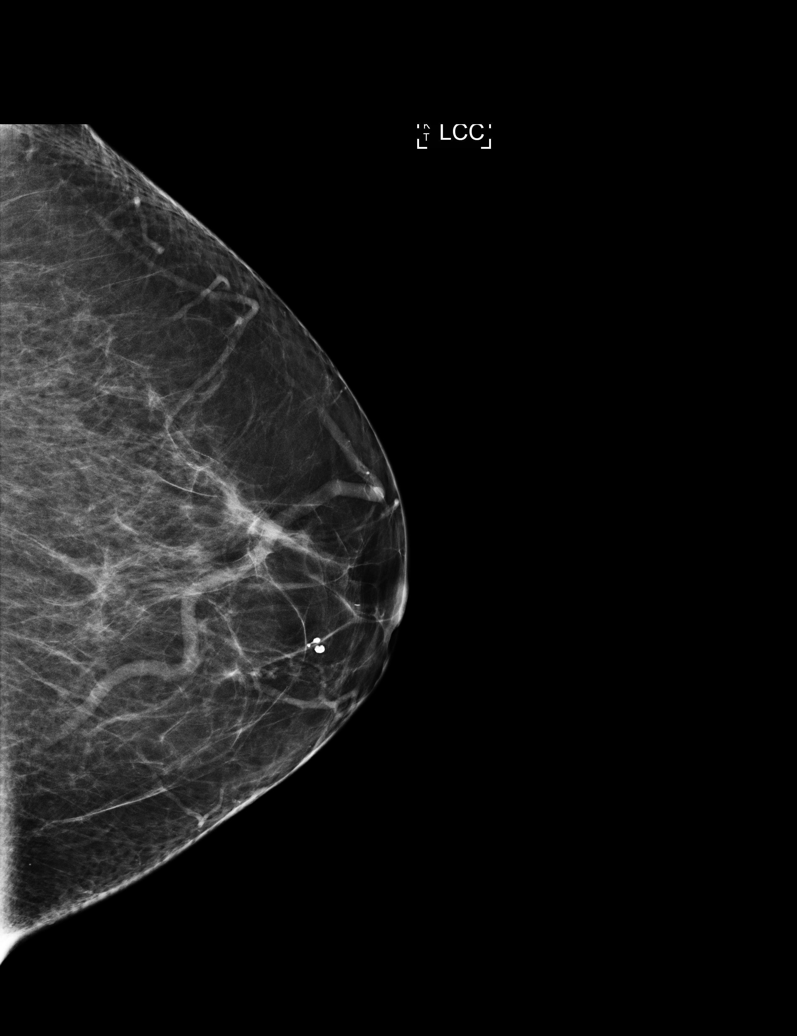

[R CC]
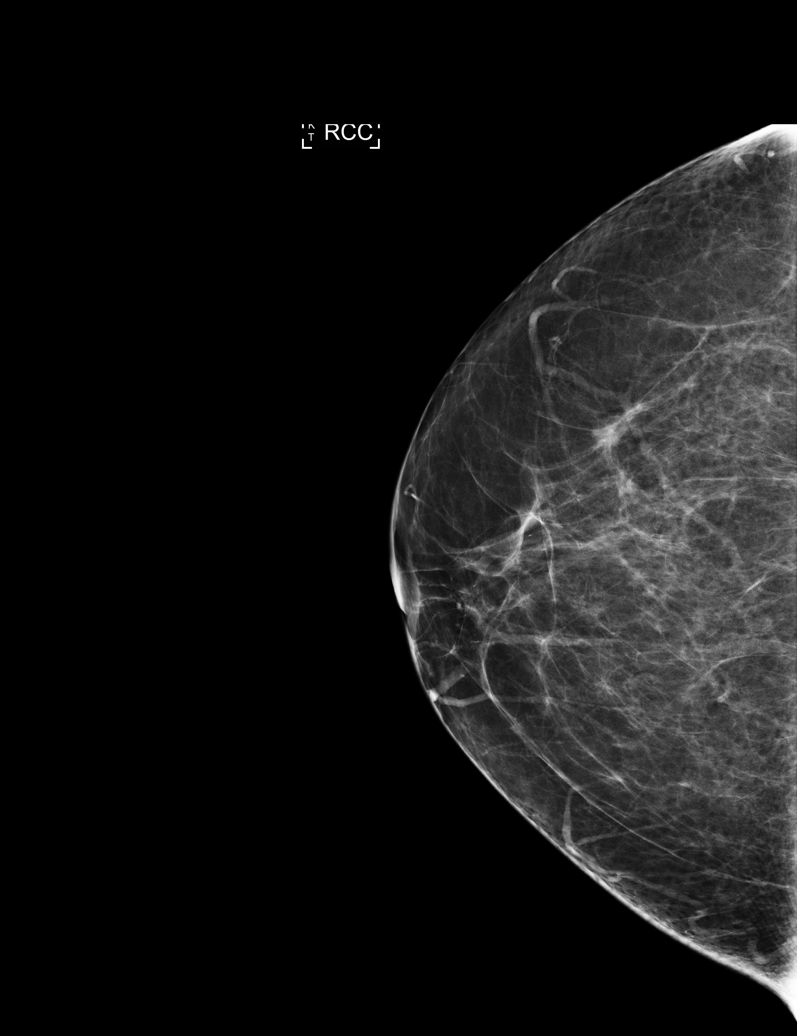

[L MLO]
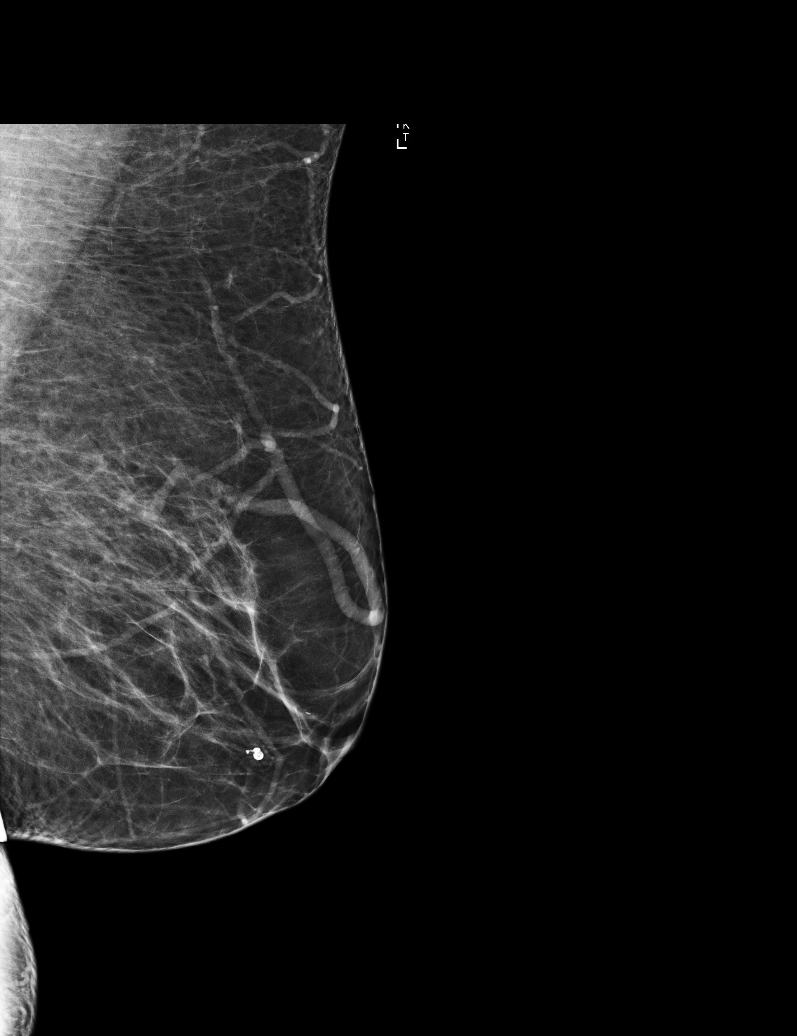

[5 of 5 positions shown; findings below may reference images not displayed]

ACR Breast Density Category b: There are scattered areas of
fibroglandular density.
FINDINGS: There are no findings suspicious for malignancy.
IMPRESSION: No mammographic evidence of malignancy. A result letter of this
screening mammogram will be mailed directly to the patient.

RECOMMENDATION:
Screening mammogram in one year. (Code:[id number]-V-ZRK)

BI-RADS CATEGORY  1: Negative.

## 2022-11-17 DIAGNOSIS — Z419 Encounter for procedure for purposes other than remedying health state, unspecified: Secondary | ICD-10-CM | POA: Diagnosis not present

## 2022-11-21 ENCOUNTER — Ambulatory Visit: Payer: Commercial Managed Care - HMO | Admitting: Internal Medicine

## 2022-11-21 DIAGNOSIS — E782 Mixed hyperlipidemia: Secondary | ICD-10-CM

## 2022-11-21 DIAGNOSIS — R7302 Impaired glucose tolerance (oral): Secondary | ICD-10-CM

## 2022-11-29 ENCOUNTER — Ambulatory Visit: Payer: Medicaid Other | Admitting: Internal Medicine

## 2022-12-06 ENCOUNTER — Ambulatory Visit (INDEPENDENT_AMBULATORY_CARE_PROVIDER_SITE_OTHER): Payer: Medicaid Other | Admitting: Internal Medicine

## 2022-12-06 ENCOUNTER — Encounter: Payer: Self-pay | Admitting: Internal Medicine

## 2022-12-06 ENCOUNTER — Telehealth: Payer: Self-pay | Admitting: *Deleted

## 2022-12-06 VITALS — BP 149/83 | HR 67 | Temp 97.6°F | Wt 208.0 lb

## 2022-12-06 DIAGNOSIS — I10 Essential (primary) hypertension: Secondary | ICD-10-CM | POA: Diagnosis not present

## 2022-12-06 DIAGNOSIS — E782 Mixed hyperlipidemia: Secondary | ICD-10-CM | POA: Diagnosis not present

## 2022-12-06 DIAGNOSIS — E1169 Type 2 diabetes mellitus with other specified complication: Secondary | ICD-10-CM

## 2022-12-06 DIAGNOSIS — E890 Postprocedural hypothyroidism: Secondary | ICD-10-CM

## 2022-12-06 DIAGNOSIS — E559 Vitamin D deficiency, unspecified: Secondary | ICD-10-CM

## 2022-12-06 DIAGNOSIS — R7302 Impaired glucose tolerance (oral): Secondary | ICD-10-CM

## 2022-12-06 DIAGNOSIS — E669 Obesity, unspecified: Secondary | ICD-10-CM

## 2022-12-06 LAB — POCT GLYCOSYLATED HEMOGLOBIN (HGB A1C): Hemoglobin A1C: 6.5 % — AB (ref 4.0–5.6)

## 2022-12-06 LAB — LIPID PANEL
Cholesterol: 139 mg/dL (ref 0–200)
HDL: 73.4 mg/dL (ref >39.00)
LDL Cholesterol: 55 mg/dL (ref 0–99)
NonHDL: 65.53
Total CHOL/HDL Ratio: 2
Triglycerides: 52 mg/dL (ref 0.0–149.0)
VLDL: 10.4 mg/dL (ref 0.0–40.0)

## 2022-12-06 LAB — MICROALBUMIN / CREATININE URINE RATIO
Creatinine,U: 96.2 mg/dL
Microalb Creat Ratio: 0.8 mg/g (ref 0.0–30.0)
Microalb, Ur: 0.7 mg/dL (ref 0.0–1.9)

## 2022-12-06 MED ORDER — AMLODIPINE BESYLATE 5 MG PO TABS: 5.0000 mg | ORAL_TABLET | Freq: Every day | ORAL | 1 refills | Status: DC

## 2022-12-06 MED ORDER — OZEMPIC (0.25 OR 0.5 MG/DOSE) 2 MG/3ML ~~LOC~~ SOPN: 0.5000 mg | PEN_INJECTOR | SUBCUTANEOUS | 2 refills | Status: DC

## 2022-12-06 NOTE — Progress Notes (Signed)
Established Patient Office Visit     CC/Reason for Visit: Follow-up chronic conditions  HPI: Raven Mendoza is a 62 y.o. female who is coming in today for the above mentioned reasons. Past Medical History is significant for: Obesity, hypothyroidism, vitamin D deficiency.  At last visit she was diagnosed with diabetes as well as hyperlipidemia.  She has been noted to have elevated blood pressure measurements and has been doing ambulatory monitoring.  I will insert below:  131/83 145/79 140/81 135/84 144/81 140/80 132/76 134/83 134/84    Past Medical/Surgical History: Past Medical History:  Diagnosis Date   Allergy to bee sting 04/20/2008   Anaphylaxis   Arthritis    Hyperthyroidism    Hypothyroidism    Obesity (BMI 35.0-39.9 without comorbidity)    Osteoarthritis    Vitamin D deficiency     Past Surgical History:  Procedure Laterality Date   c sections  1987 and 1990   COLONOSCOPY  03/24/2004   Dr.Johnson normal excellent prep   KNEE CLOSED REDUCTION  03/26/2012   Procedure: CLOSED MANIPULATION KNEE;  Surgeon: Mauri Pole, MD;  Location: [facility];  Service: Orthopedics;  Laterality: Left;   radioactive iodine tx for thyroid  4 yrs ago,and  5 yrs ago   [place] Left 2019   TOTAL HIP ARTHROPLASTY  10/24/2011   right   TOTAL KNEE ARTHROPLASTY  01/16/2012   left   TOTAL KNEE ARTHROPLASTY  03/26/2012   Procedure: TOTAL KNEE ARTHROPLASTY;  Surgeon: Mauri Pole, MD;  Location: [facility];  Service: Orthopedics;  Laterality: Right;    Social History:  reports that she has never smoked. She has never used smokeless tobacco. She reports that she does not drink alcohol and does not use drugs.  Allergies: Allergies  Allergen Reactions   Penicillins Swelling    Pt reports remembers hand swelling, but not sure what else swelled up. Reports reaction was as a child.    Family History:  Family History  Problem Relation Age of Onset   Colon  cancer Brother 20   Cancer Brother 38       colon cancer   Diabetes Paternal Grandmother    Breast cancer Neg Hx    Colon polyps Neg Hx    Esophageal cancer Neg Hx    Rectal cancer Neg Hx    Stomach cancer Neg Hx      Current Outpatient Medications:    amLODipine (NORVASC) 5 MG tablet, Take 1 tablet (5 mg total) by mouth daily., Disp: 90 tablet, Rfl: 1   atorvastatin (LIPITOR) 40 MG tablet, Take 1 tablet (40 mg total) by mouth daily., Disp: 90 tablet, Rfl: 1   Cholecalciferol (VITAMIN D) 50 MCG (2000 UT) CAPS, Take 2,000 Units by mouth daily with breakfast., Disp: , Rfl:    levothyroxine (SYNTHROID) 112 MCG tablet, Take 1 tablet (112 mcg total) by mouth daily before breakfast. TAKE 1 TABLET DAILY BY MOUTH BEFORE BREAKFAST, Disp: 90 tablet, Rfl: 1   meloxicam (MOBIC) 15 MG tablet, Take 1 tablet (15 mg total) by mouth daily., Disp: 90 tablet, Rfl: 1   Semaglutide,0.25 or 0.5MG /DOS, (OZEMPIC, 0.25 OR 0.5 MG/DOSE,) 2 MG/3ML SOPN, Inject 0.5 mg into the skin once a week., Disp: 3 mL, Rfl: 2  Review of Systems:  Negative unless indicated in HPI.   Physical Exam: Vitals:   12/06/22 0937 12/06/22 0940  BP: (!) 159/79 (!) 149/83  Pulse: 67   Temp: 97.6 F (36.4 C)  TempSrc: Oral   SpO2: 97%   Weight: 208 lb (94.3 kg)     Body mass index is 37.44 kg/m.   Physical Exam Vitals reviewed.  Constitutional:      Appearance: Normal appearance.  HENT:     Head: Normocephalic and atraumatic.  Eyes:     Conjunctiva/sclera: Conjunctivae normal.     Pupils: Pupils are equal, round, and reactive to light.  Cardiovascular:     Rate and Rhythm: Normal rate and regular rhythm.  Pulmonary:     Effort: Pulmonary effort is normal.     Breath sounds: Normal breath sounds.  Skin:    General: Skin is warm and dry.  Neurological:     General: No focal deficit present.     Mental Status: She is alert and oriented to person, place, and time.  Psychiatric:        Mood and Affect: Mood  normal.        Behavior: Behavior normal.        Thought Content: Thought content normal.        Judgment: Judgment normal.      Impression and Plan:  Type 2 diabetes mellitus with other specified complication, without long-term current use of insulin (HCC) - Plan: Semaglutide,0.25 or 0.5MG /DOS, (OZEMPIC, 0.25 OR 0.5 MG/DOSE,) 2 MG/3ML SOPN  IGT (impaired glucose tolerance) - Plan: POC HgB A1c, Urine microalbumin-creatinine with uACR  Mixed hyperlipidemia - Plan: Lipid panel  Obesity (BMI 35.0-39.9 without comorbidity)  Primary hypertension - Plan: amLODipine (NORVASC) 5 MG tablet  Hypothyroidism following radioiodine therapy  Vitamin D deficiency  -A1c of 6.5 does demonstrate new onset diabetes.  We have discussed lifestyle changes.  We have elected to start Ozempic 0.5 mg weekly. -With elevated blood pressures both in the office and at home I will go ahead and make diagnosis of hypertension today.  Start amlodipine 5 mg daily, continue ambulatory measurements and return in 3 months for follow-up. -She was started on atorvastatin 40 mg in December, check lipids today.  Time spent:34 minutes reviewing chart, interviewing and examining patient and formulating plan of care.     Lelon Frohlich, MD [facility]

## 2022-12-06 NOTE — Patient Instructions (Signed)
-  Nice seeing you today!!  -Lab work today; will notify you once results are available.  -Start amlodipine 5 mg daily for blood pressure. Keep checking home measurements as you are.  -Start Ozempic 0.5 mg weekly for your diabetes.  -Schedule follow up in 3 months.

## 2022-12-06 NOTE — Telephone Encounter (Signed)
Prior Raven Mendoza has been started for Ozempic Key: [id number]

## 2022-12-12 NOTE — Telephone Encounter (Signed)
Pt is aware PA has been started for ozempic

## 2022-12-13 NOTE — Telephone Encounter (Signed)
This request has received a Unfavorable outcome.

## 2022-12-18 DIAGNOSIS — Z419 Encounter for procedure for purposes other than remedying health state, unspecified: Secondary | ICD-10-CM | POA: Diagnosis not present

## 2022-12-19 NOTE — Telephone Encounter (Signed)
Pharmacy Patient Advocate Encounter  Received notification from [facility] that the request for prior authorization for Ozempic has been denied due to Beneficiary must have a trial and failure or insufficient response to metformin containing products OR  o Beneficiary has a contraindication or adverse event to metformin, OR  o Beneficiary has established ASCVD, or Chronic Kidney Disease, OR  o Beneficiary is considered high-risk for ASCVD as defined as ? 64 years of age with ? 2 additional risk  factors (smoking, obesity, hypertension, dyslipidemia, or albuminuria.    Please be advised we currently do not have a Pharmacist to review denials, therefore you will need to process appeals accordingly as needed. Thanks for your support at this time.   You may call [phone] to appeal.   Denial letter attached to chart.

## 2022-12-21 NOTE — Telephone Encounter (Signed)
Tried to resubmit PA for Ozempic through [facility]. Plan did not allow because the previous request was denied. An appeal will have to be done. Please be advised we currently do not have a Pharmacist to review denials, therefore you will need to process appeals accordingly.  You may call : [phone] or fax [phone], to appeal.  There is an appeal form in the denial letter which is attached to charts (in media).

## 2022-12-21 NOTE — Telephone Encounter (Signed)
Office notes faxed to [phone]

## 2022-12-25 NOTE — Telephone Encounter (Signed)
Received a letter from [facility] asking the patient for consent for the appeal.

## 2023-01-09 NOTE — Telephone Encounter (Signed)
Appeal was received.  The appeal will be finished by 01/23/23.

## 2023-01-17 DIAGNOSIS — Z419 Encounter for procedure for purposes other than remedying health state, unspecified: Secondary | ICD-10-CM | POA: Diagnosis not present

## 2023-01-19 ENCOUNTER — Telehealth: Payer: Self-pay | Admitting: Internal Medicine

## 2023-01-19 NOTE — Telephone Encounter (Signed)
Has patient had adverse events using metformin, any contraindications

## 2023-01-22 NOTE — Telephone Encounter (Signed)
Form completed, faxed and confirmed to [facility].

## 2023-01-29 ENCOUNTER — Telehealth: Payer: Self-pay | Admitting: "Endocrinology

## 2023-01-29 DIAGNOSIS — E890 Postprocedural hypothyroidism: Secondary | ICD-10-CM

## 2023-01-29 NOTE — Telephone Encounter (Signed)
Labs updated and sent to [facility]. 

## 2023-01-29 NOTE — Telephone Encounter (Signed)
Labs need updating, she had to move her appt to July

## 2023-01-30 NOTE — Telephone Encounter (Signed)
Patient Advocate Encounter  Received a fax from [facility] regarding Prior [employer] for [employer].   Authorization Appeal has been DENIED due to

## 2023-01-31 ENCOUNTER — Encounter: Payer: Self-pay | Admitting: Internal Medicine

## 2023-01-31 NOTE — Telephone Encounter (Signed)
System was not able to process the request because the previous Prior Authorization Request was Denied. I called [facility] and a 2nd level of appeal will be faxed

## 2023-02-01 ENCOUNTER — Ambulatory Visit: Payer: 59 | Admitting: "Endocrinology

## 2023-02-01 NOTE — Telephone Encounter (Signed)
Left detailed message on machine for a state fair hearing.  [phone]

## 2023-02-05 NOTE — Telephone Encounter (Signed)
2nd attempt Left message on machine for a state fair hearing

## 2023-02-09 LAB — HM DIABETES EYE EXAM

## 2023-02-17 DIAGNOSIS — Z419 Encounter for procedure for purposes other than remedying health state, unspecified: Secondary | ICD-10-CM | POA: Diagnosis not present

## 2023-02-21 DIAGNOSIS — H5213 Myopia, bilateral: Secondary | ICD-10-CM | POA: Diagnosis not present

## 2023-03-08 ENCOUNTER — Ambulatory Visit (INDEPENDENT_AMBULATORY_CARE_PROVIDER_SITE_OTHER): Payer: Medicaid Other | Admitting: Internal Medicine

## 2023-03-08 ENCOUNTER — Encounter: Payer: Self-pay | Admitting: Internal Medicine

## 2023-03-08 VITALS — BP 129/69 | HR 70 | Temp 97.6°F | Wt 206.3 lb

## 2023-03-08 DIAGNOSIS — Z7984 Long term (current) use of oral hypoglycemic drugs: Secondary | ICD-10-CM

## 2023-03-08 DIAGNOSIS — E890 Postprocedural hypothyroidism: Secondary | ICD-10-CM | POA: Diagnosis not present

## 2023-03-08 DIAGNOSIS — E1169 Type 2 diabetes mellitus with other specified complication: Secondary | ICD-10-CM

## 2023-03-08 DIAGNOSIS — I152 Hypertension secondary to endocrine disorders: Secondary | ICD-10-CM

## 2023-03-08 DIAGNOSIS — E1159 Type 2 diabetes mellitus with other circulatory complications: Secondary | ICD-10-CM

## 2023-03-08 DIAGNOSIS — E782 Mixed hyperlipidemia: Secondary | ICD-10-CM

## 2023-03-08 DIAGNOSIS — Z1283 Encounter for screening for malignant neoplasm of skin: Secondary | ICD-10-CM

## 2023-03-08 LAB — POCT GLYCOSYLATED HEMOGLOBIN (HGB A1C): Hemoglobin A1C: 6.8 % — AB (ref 4.0–5.6)

## 2023-03-08 MED ORDER — METFORMIN HCL 1000 MG PO TABS: 1000.0000 mg | ORAL_TABLET | Freq: Every day | ORAL | 1 refills | Status: DC

## 2023-03-08 NOTE — Assessment & Plan Note (Signed)
Well-controlled on amlodipine 5 mg daily. 

## 2023-03-08 NOTE — Progress Notes (Signed)
Established Patient Office Visit     CC/Reason for Visit: Follow-up chronic conditions  HPI: Raven Mendoza is a 62 y.o. female who is coming in today for the above mentioned reasons. Past Medical History is significant for: Hypertension, hyperlipidemia, type 2 diabetes, hypothyroidism, obesity, vitamin D deficiency.  She is feeling well.  Insurance denied Ozempic despite multiple appeals.  She is feeling well.  She has been compliant with amlodipine and atorvastatin.  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   Past Medical/Surgical History: Past Medical History:  Diagnosis Date   Allergy to bee sting 04/20/2008   Anaphylaxis   Arthritis    Hyperthyroidism    Hypothyroidism    Obesity (BMI 35.0-39.9 without comorbidity)    Osteoarthritis    Vitamin D deficiency     Past Surgical History:  Procedure Laterality Date   c sections  1987 and 1990   COLONOSCOPY  03/24/2004   Dr.Johnson normal excellent prep   KNEE CLOSED REDUCTION  03/26/2012   Procedure: CLOSED MANIPULATION KNEE;  Surgeon: Shelda Pal, MD;  Location: [facility];  Service: Orthopedics;  Laterality: Left;   radioactive iodine tx for thyroid  4 yrs ago,and  5 yrs ago   REVISION TOTAL HIP ARTHROPLASTY Left 2019   TOTAL HIP ARTHROPLASTY  10/24/2011   right   TOTAL KNEE ARTHROPLASTY  01/16/2012   left   TOTAL KNEE ARTHROPLASTY  03/26/2012   Procedure: TOTAL KNEE ARTHROPLASTY;  Surgeon: Shelda Pal, MD;  Location: [facility];  Service: Orthopedics;  Laterality: Right;    Social History:  reports that she has never smoked. She has never used smokeless tobacco. She reports that she does not drink alcohol and does not use drugs.  Allergies: Allergies  Allergen Reactions   Penicillins Swelling    Pt reports remembers hand swelling, but not sure what else swelled up. Reports reaction was as a child.    Family History:  Family  History  Problem Relation Age of Onset   Colon cancer Brother 47   Cancer Brother 54       colon cancer   Diabetes Paternal Grandmother    Breast cancer Neg Hx    Colon polyps Neg Hx    Esophageal cancer Neg Hx    Rectal cancer Neg Hx    Stomach cancer Neg Hx      Current Outpatient Medications:    amLODipine (NORVASC) 5 MG tablet, Take 1 tablet (5 mg total) by mouth daily., Disp: 90 tablet, Rfl: 1   atorvastatin (LIPITOR) 40 MG tablet, Take 1 tablet (40 mg total) by mouth daily., Disp: 90 tablet, Rfl: 1   Cholecalciferol (VITAMIN D) 50 MCG (2000 UT) CAPS, Take 2,000 Units by mouth daily with breakfast., Disp: , Rfl:    levothyroxine (SYNTHROID) 112 MCG tablet, Take 1 tablet (112 mcg total) by mouth daily before breakfast. TAKE 1 TABLET DAILY BY MOUTH BEFORE BREAKFAST, Disp: 90 tablet, Rfl: 1   meloxicam (MOBIC) 15 MG tablet, Take 1 tablet (15 mg total) by mouth daily., Disp: 90 tablet, Rfl: 1   metFORMIN (GLUCOPHAGE) 1000 MG tablet, Take 1 tablet (1,000 mg total) by mouth daily with breakfast., Disp: 90 tablet, Rfl: 1   Semaglutide,0.25 or 0.5MG /DOS, (OZEMPIC, 0.25 OR 0.5 MG/DOSE,) 2 MG/3ML SOPN, Inject 0.5 mg into the skin once a week. (Patient not taking: Reported on 03/08/2023), Disp: 3 mL,  Rfl: 2  Review of Systems:  Negative unless indicated in HPI.   Physical Exam: Vitals:   03/08/23 0822  BP: 129/69  Pulse: 70  Temp: 97.6 F (36.4 C)  TempSrc: Oral  SpO2: 97%  Weight: 206 lb 4.8 oz (93.6 kg)    Body mass index is 37.13 kg/m.   Physical Exam Vitals reviewed.  Constitutional:      Appearance: Normal appearance.  HENT:     Head: Normocephalic and atraumatic.  Eyes:     Conjunctiva/sclera: Conjunctivae normal.     Pupils: Pupils are equal, round, and reactive to light.  Cardiovascular:     Rate and Rhythm: Normal rate and regular rhythm.  Pulmonary:     Effort: Pulmonary effort is normal.     Breath sounds: Normal breath sounds.  Skin:    General: Skin is  warm and dry.  Neurological:     General: No focal deficit present.     Mental Status: She is alert and oriented to person, place, and time.  Psychiatric:        Mood and Affect: Mood normal.        Behavior: Behavior normal.        Thought Content: Thought content normal.        Judgment: Judgment normal.      Impression and Plan:  Type 2 diabetes mellitus with other specified complication, without long-term current use of insulin (HCC) Assessment & Plan: A1c at goal but increased to 6.8. Start metformin 1000 mg every day. Insurance denied ozempic.  Orders: -     POCT glycosylated hemoglobin (Hb A1C) -     metFORMIN HCl; Take 1 tablet (1,000 mg total) by mouth daily with breakfast.  Dispense: 90 tablet; Refill: 1  Mixed hyperlipidemia Assessment & Plan: LDL 55 on atorvastatin 40 mg daily.   Morbid obesity (HCC) Assessment & Plan: -Discussed healthy lifestyle, including increased physical activity and better food choices to promote weight loss.    Postablative hypothyroidism Assessment & Plan: On levothyroxine.   Hypertension associated with diabetes (HCC) Assessment & Plan: Well controlled on amlodipine 5 mg daily.   Skin cancer screening -     Ambulatory referral to Dermatology     Time spent:33 minutes reviewing chart, interviewing and examining patient and formulating plan of care.     Chaya Jan, MD [facility]

## 2023-03-08 NOTE — Assessment & Plan Note (Signed)
A1c at goal but increased to 6.8. Start metformin 1000 mg every day. Insurance denied ozempic.

## 2023-03-08 NOTE — Assessment & Plan Note (Signed)
LDL 55 on atorvastatin 40 mg daily.

## 2023-03-08 NOTE — Assessment & Plan Note (Signed)
Discussed healthy lifestyle, including increased physical activity and better food choices to promote weight loss.  

## 2023-03-08 NOTE — Assessment & Plan Note (Signed)
On levothyroxine  ?

## 2023-03-12 DIAGNOSIS — E890 Postprocedural hypothyroidism: Secondary | ICD-10-CM | POA: Diagnosis not present

## 2023-03-13 LAB — LIPID PANEL
Chol/HDL Ratio: 2 ratio (ref 0.0–4.4)
Cholesterol, Total: 176 mg/dL (ref 100–199)
HDL: 89 mg/dL (ref >39)
LDL Chol Calc (NIH): 76 mg/dL (ref 0–99)
Triglycerides: 57 mg/dL (ref 0–149)
VLDL Cholesterol Cal: 11 mg/dL (ref 5–40)

## 2023-03-13 LAB — T4, FREE: Free T4: 1.42 ng/dL (ref 0.82–1.77)

## 2023-03-13 LAB — TSH: TSH: 0.563 u[IU]/mL (ref 0.450–4.500)

## 2023-03-19 DIAGNOSIS — Z419 Encounter for procedure for purposes other than remedying health state, unspecified: Secondary | ICD-10-CM | POA: Diagnosis not present

## 2023-03-27 ENCOUNTER — Ambulatory Visit: Payer: Medicaid Other | Admitting: "Endocrinology

## 2023-03-27 ENCOUNTER — Encounter: Payer: Self-pay | Admitting: "Endocrinology

## 2023-03-27 VITALS — BP 124/66 | HR 76 | Ht 62.5 in | Wt 209.4 lb

## 2023-03-27 DIAGNOSIS — E782 Mixed hyperlipidemia: Secondary | ICD-10-CM

## 2023-03-27 DIAGNOSIS — E890 Postprocedural hypothyroidism: Secondary | ICD-10-CM

## 2023-03-27 MED ORDER — ATORVASTATIN CALCIUM 40 MG PO TABS: 40.0000 mg | ORAL_TABLET | Freq: Every day | ORAL | 1 refills | Status: DC

## 2023-03-27 MED ORDER — LEVOTHYROXINE SODIUM 112 MCG PO TABS: 112.0000 ug | ORAL_TABLET | Freq: Every day | ORAL | 1 refills | Status: DC

## 2023-03-27 NOTE — Progress Notes (Signed)
03/27/2023  Endocrinology follow-up note   Subjective:    Patient ID: Raven Mendoza, female    DOB: 11/28/1960, PCP Philip Aspen, Limmie Patricia, MD   Past Medical History:  Diagnosis Date   Allergy to bee sting 04/20/2008   Anaphylaxis   Arthritis    Hyperthyroidism    Hypothyroidism    Obesity (BMI 35.0-39.9 without comorbidity)    Osteoarthritis    Vitamin D deficiency    Past Surgical History:  Procedure Laterality Date   c sections  1987 and 1990   COLONOSCOPY  03/24/2004   Dr.Johnson normal excellent prep   KNEE CLOSED REDUCTION  03/26/2012   Procedure: CLOSED MANIPULATION KNEE;  Surgeon: Shelda Pal, MD;  Location: [facility];  Service: Orthopedics;  Laterality: Left;   radioactive iodine tx for thyroid  4 yrs ago,and  5 yrs ago   REVISION TOTAL HIP ARTHROPLASTY Left 2019   TOTAL HIP ARTHROPLASTY  10/24/2011   right   TOTAL KNEE ARTHROPLASTY  01/16/2012   left   TOTAL KNEE ARTHROPLASTY  03/26/2012   Procedure: TOTAL KNEE ARTHROPLASTY;  Surgeon: Shelda Pal, MD;  Location: [facility];  Service: Orthopedics;  Laterality: Right;   Social History   Socioeconomic History   Marital status: Married    Spouse name: Not on file   Number of children: Not on file   Years of education: Not on file   Highest education level: Not on file  Occupational History   Not on file  Tobacco Use   Smoking status: Never   Smokeless tobacco: Never  Vaping Use   Vaping Use: Never used  Substance and Sexual Activity   Alcohol use: No   Drug use: No   Sexual activity: Not on file  Other Topics Concern   Not on file  Social History Narrative   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    She is active and involved with the farm.   Social Determinants of Health   Financial Resource Strain: Not on file  Food Insecurity: Not on file  Transportation Needs: Not on file  Physical Activity: Not on file  Stress: Not on file  Social Connections: Not on file   Outpatient Encounter  Medications as of 03/27/2023  Medication Sig   amLODipine (NORVASC) 5 MG tablet Take 1 tablet (5 mg total) by mouth daily.   atorvastatin (LIPITOR) 40 MG tablet Take 1 tablet (40 mg total) by mouth daily.   Cholecalciferol (VITAMIN D) 50 MCG (2000 UT) CAPS Take 2,000 Units by mouth daily with breakfast.   levothyroxine (SYNTHROID) 112 MCG tablet Take 1 tablet (112 mcg total) by mouth daily before breakfast. TAKE 1 TABLET DAILY BY MOUTH BEFORE BREAKFAST   meloxicam (MOBIC) 15 MG tablet Take 1 tablet (15 mg total) by mouth daily.   metFORMIN (GLUCOPHAGE) 1000 MG tablet Take 1 tablet (1,000 mg total) by mouth daily with breakfast.   [DISCONTINUED] atorvastatin (LIPITOR) 40 MG tablet Take 1 tablet (40 mg total) by mouth daily.   [DISCONTINUED] levothyroxine (SYNTHROID) 112 MCG tablet Take 1 tablet (112 mcg total) by mouth daily before breakfast. TAKE 1 TABLET DAILY BY MOUTH BEFORE BREAKFAST   [DISCONTINUED] Semaglutide,0.25 or 0.5MG /DOS, (OZEMPIC, 0.25 OR 0.5 MG/DOSE,) 2 MG/3ML SOPN Inject 0.5 mg into the skin once a week. (Patient not taking: Reported on 03/08/2023)   No facility-administered encounter medications on file as of 03/27/2023.   ALLERGIES: Allergies  Allergen Reactions   Penicillins Swelling    Pt reports remembers  hand swelling, but not sure what else swelled up. Reports reaction was as a child.    VACCINATION STATUS: Immunization History  Administered Date(s) Administered   Influenza,inj,Quad PF,6+ Mos 08/01/2019, 05/17/2021, 07/17/2022   Td 08/12/2002   Tdap 12/03/2013   Zoster Recombinant(Shingrix) 02/11/2021, 05/17/2021    HPI  62 year old female patient with medical history as above. 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  - Review of her medical records show that she did receive I-131 therapy on 2 occasions (18.2 mCi on 09/19/2005 and 26.1 mCi on 04/04/2006 due to refractory Graves' disease). - She was started on thyroid hormone replacements which was  adjusted several times over the years.  She is currently on levothyroxine 112 mcg p.o. daily before breakfast.  Her previsit labs are consistent with appropriate replacement.        She reports compliance to this medication. She has no new coplaints. -She has hyperlipidemia currently on Lipitor 20 mg p.o. nightly.    She denies heat nor cold intolerance. She denies dysphagia, shortness of breath, voice change. She has significant family history of various thyroid dysfunctions. She denies any family history of thyroid cancer. -She has steady weight since last visit.     Review of Systems Limited as above.  Objective:    BP 124/66   Pulse 76   Ht 5' 2.5" (1.588 m)   Wt 209 lb 6.4 oz (95 kg)   LMP 10/23/2008   BMI 37.69 kg/m   Wt Readings from Last 3 Encounters:  03/27/23 209 lb 6.4 oz (95 kg)  03/08/23 206 lb 4.8 oz (93.6 kg)  12/06/22 208 lb (94.3 kg)    Physical Exam    CMP ( most recent) CMP     Component Value Date/Time   NA 137 07/17/2022 0755   K 4.0 07/17/2022 0755   CL 102 07/17/2022 0755   CO2 25 07/17/2022 0755   GLUCOSE 117 (H) 07/17/2022 0755   BUN 23 07/17/2022 0755   CREATININE 0.66 07/17/2022 0755   CREATININE 0.67 08/01/2019 1431   CALCIUM 9.6 07/17/2022 0755   PROT 7.7 07/17/2022 0755   ALBUMIN 4.4 07/17/2022 0755   AST 19 07/17/2022 0755   ALT 15 07/17/2022 0755   ALKPHOS 76 07/17/2022 0755   BILITOT 0.7 07/17/2022 0755   GFRNONAA 85 11/10/2016 0822   GFRAA >89 11/10/2016 0822    Lipid Panel     Component Value Date/Time   CHOL 176 03/12/2023 1551   TRIG 57 03/12/2023 1551   HDL 89 03/12/2023 1551   CHOLHDL 2.0 03/12/2023 1551   CHOLHDL 2 12/06/2022 1012   VLDL 10.4 12/06/2022 1012   LDLCALC 76 03/12/2023 1551   Recent Results (from the past 2160 hour(s))  HM DIABETES EYE EXAM     Status: None   Collection Time: 02/09/23 10:48 AM  Result Value Ref Range   HM Diabetic Eye Exam No Retinopathy No Retinopathy    Comment: Abstracted by  HIM  POC HgB A1c     Status: Abnormal   Collection Time: 03/08/23  8:28 AM  Result Value Ref Range   Hemoglobin A1C 6.8 (A) 4.0 - 5.6 %   HbA1c POC (<> result, manual entry)     HbA1c, POC (prediabetic range)     HbA1c, POC (controlled diabetic range)    T4, Free     Status: None   Collection Time: 03/12/23  3:51 PM  Result Value Ref Range   Free T4 1.42  0.82 - 1.77 ng/dL  TSH     Status: None   Collection Time: 03/12/23  3:51 PM  Result Value Ref Range   TSH 0.563 0.450 - 4.500 uIU/mL  Lipid Panel     Status: None   Collection Time: 03/12/23  3:51 PM  Result Value Ref Range   Cholesterol, Total 176 100 - 199 mg/dL   Triglycerides 57 0 - 149 mg/dL   HDL 89 >16 mg/dL   VLDL Cholesterol Cal 11 5 - 40 mg/dL   LDL Chol Calc (NIH) 76 0 - 99 mg/dL   Chol/HDL Ratio 2.0 0.0 - 4.4 ratio    Comment:                                   T. Chol/HDL Ratio                                             Men  Women                               1/2 Avg.Risk  3.4    3.3                                   Avg.Risk  5.0    4.4                                2X Avg.Risk  9.6    7.1                                3X Avg.Risk 23.4   11.0      Assessment & Plan:   1. 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  Her previsit thyroid function tests are consistent with appropriate replacement.  She is advised to continue Synthroid 112 p.o. daily before breakfast.     - We discussed about the correct intake of her thyroid hormone, on empty stomach at fasting, with water, separated by at least 30 minutes from breakfast and other medications,  and separated by more than 4 hours from calcium, iron, multivitamins, acid reflux medications (PPIs). -Patient is made aware of the fact that thyroid hormone replacement is needed for life, dose to be adjusted by periodic monitoring of thyroid function tests.  - She  does not have clinical goiter, no need for thyroid imaging at this time.  2. hyperlipidemia: Her LDL is controlled at 76, I encouraged her to continue atorvastatin 40 mg p.o. nightly.  Side effects and precautions discussed with her.  Whole food plant-based diet was discussed briefly with her.   3. Vitamin D deficiency -Her vitamin D is replete at 15.  She is advised to lower her vitamin D supplement to 2000 units daily.     - I advised patient to maintain close follow up with Philip Aspen, Limmie Patricia, MD for primary care needs.   I spent  20  minutes in the care of the patient today including  review of labs from Thyroid Function, CMP, and other relevant labs ; imaging/biopsy records (current and previous including abstractions from other facilities); face-to-face time discussing  her lab results and symptoms, medications doses, her options of short and long term treatment based on the latest standards of care / guidelines;   and documenting the encounter.  Chales Abrahams Hatcher  participated in the discussions, expressed understanding, and voiced agreement with the above plans.  All questions were answered to her satisfaction. she is encouraged to contact clinic should she have any questions or concerns prior to her return visit.     Follow up plan: Return in about 6 months (around 09/27/2023) for Fasting Labs  in AM B4 8.  Marquis Lunch, MD Phone: [phone]  Fax: [phone]   03/27/2023, 2:55 PM

## 2023-03-27 NOTE — Patient Instructions (Signed)

## 2023-04-12 DIAGNOSIS — H524 Presbyopia: Secondary | ICD-10-CM | POA: Diagnosis not present

## 2023-04-19 DIAGNOSIS — Z419 Encounter for procedure for purposes other than remedying health state, unspecified: Secondary | ICD-10-CM | POA: Diagnosis not present

## 2023-04-28 ENCOUNTER — Other Ambulatory Visit: Payer: Self-pay | Admitting: "Endocrinology

## 2023-04-28 DIAGNOSIS — E890 Postprocedural hypothyroidism: Secondary | ICD-10-CM

## 2023-05-03 ENCOUNTER — Encounter (INDEPENDENT_AMBULATORY_CARE_PROVIDER_SITE_OTHER): Payer: Self-pay

## 2023-05-20 DIAGNOSIS — Z419 Encounter for procedure for purposes other than remedying health state, unspecified: Secondary | ICD-10-CM | POA: Diagnosis not present

## 2023-06-07 ENCOUNTER — Encounter: Payer: Self-pay | Admitting: Internal Medicine

## 2023-06-07 ENCOUNTER — Ambulatory Visit (INDEPENDENT_AMBULATORY_CARE_PROVIDER_SITE_OTHER): Payer: Medicaid Other | Admitting: Internal Medicine

## 2023-06-07 VITALS — BP 120/78 | HR 64 | Temp 98.2°F | Wt 206.7 lb

## 2023-06-07 DIAGNOSIS — E782 Mixed hyperlipidemia: Secondary | ICD-10-CM

## 2023-06-07 DIAGNOSIS — Z23 Encounter for immunization: Secondary | ICD-10-CM

## 2023-06-07 DIAGNOSIS — E1159 Type 2 diabetes mellitus with other circulatory complications: Secondary | ICD-10-CM | POA: Diagnosis not present

## 2023-06-07 DIAGNOSIS — E890 Postprocedural hypothyroidism: Secondary | ICD-10-CM | POA: Diagnosis not present

## 2023-06-07 DIAGNOSIS — E1169 Type 2 diabetes mellitus with other specified complication: Secondary | ICD-10-CM

## 2023-06-07 DIAGNOSIS — I152 Hypertension secondary to endocrine disorders: Secondary | ICD-10-CM

## 2023-06-07 LAB — POCT GLYCOSYLATED HEMOGLOBIN (HGB A1C): Hemoglobin A1C: 6.4 % — AB (ref 4.0–5.6)

## 2023-06-07 NOTE — Assessment & Plan Note (Signed)
On atorvastatin 40 mg.  Check lipids next visit.

## 2023-06-07 NOTE — Addendum Note (Signed)
Addended by: Kern Reap B on: 06/07/2023 11:14 AM   Modules accepted: Orders

## 2023-06-07 NOTE — Assessment & Plan Note (Signed)
Discussed healthy lifestyle, including increased physical activity and better food choices to promote weight loss.

## 2023-06-07 NOTE — Assessment & Plan Note (Signed)
Well-controlled on current.

## 2023-06-07 NOTE — Assessment & Plan Note (Signed)
Continue levothyroxine, check TSH next visit.

## 2023-06-07 NOTE — Progress Notes (Signed)
Established Patient Office Visit     CC/Reason for Visit: Follow-up chronic conditions  HPI: Raven Mendoza is a 62 y.o. female who is coming in today for the above mentioned reasons. Past Medical History is significant for: Hypertension, hyperlipidemia, type 2 diabetes, hypothyroidism, obesity, vitamin D deficiency.  Feeling well without acute concerns or complaints.  Requesting flu vaccine.   Past Medical/Surgical History: Past Medical History:  Diagnosis Date   Allergy to bee sting 04/20/2008   Anaphylaxis   Arthritis    Hyperthyroidism    Hypothyroidism    Obesity (BMI 35.0-39.9 without comorbidity)    Osteoarthritis    Vitamin D deficiency     Past Surgical History:  Procedure Laterality Date   c sections  1987 and 1990   COLONOSCOPY  03/24/2004   Dr.Johnson normal excellent prep   KNEE CLOSED REDUCTION  03/26/2012   Procedure: CLOSED MANIPULATION KNEE;  Surgeon: Shelda Pal, MD;  Location: [facility];  Service: Orthopedics;  Laterality: Left;   radioactive iodine tx for thyroid  4 yrs ago,and  5 yrs ago   REVISION TOTAL HIP ARTHROPLASTY Left 2019   TOTAL HIP ARTHROPLASTY  10/24/2011   right   TOTAL KNEE ARTHROPLASTY  01/16/2012   left   TOTAL KNEE ARTHROPLASTY  03/26/2012   Procedure: TOTAL KNEE ARTHROPLASTY;  Surgeon: Shelda Pal, MD;  Location: [facility];  Service: Orthopedics;  Laterality: Right;    Social History:  reports that she has never smoked. She has never used smokeless tobacco. She reports that she does not drink alcohol and does not use drugs.  Allergies: Allergies  Allergen Reactions   Penicillins Swelling    Pt reports remembers hand swelling, but not sure what else swelled up. Reports reaction was as a child.    Family History:  Family History  Problem Relation Age of Onset   Colon cancer Brother 45   Cancer Brother 24       colon cancer   Diabetes Paternal Grandmother    Breast cancer Neg Hx    Colon polyps Neg Hx    Esophageal  cancer Neg Hx    Rectal cancer Neg Hx    Stomach cancer Neg Hx      Current Outpatient Medications:    amLODipine (NORVASC) 5 MG tablet, Take 1 tablet (5 mg total) by mouth daily., Disp: 90 tablet, Rfl: 1   atorvastatin (LIPITOR) 40 MG tablet, Take 1 tablet (40 mg total) by mouth daily., Disp: 90 tablet, Rfl: 1   Cholecalciferol (VITAMIN D) 50 MCG (2000 UT) CAPS, Take 2,000 Units by mouth daily with breakfast., Disp: , Rfl:    levothyroxine (SYNTHROID) 112 MCG tablet, TAKE 1 TABLET DAILY BEFORE BREAKFAST, Disp: 90 tablet, Rfl: 0   meloxicam (MOBIC) 15 MG tablet, Take 1 tablet (15 mg total) by mouth daily., Disp: 90 tablet, Rfl: 1   metFORMIN (GLUCOPHAGE) 1000 MG tablet, Take 1 tablet (1,000 mg total) by mouth daily with breakfast., Disp: 90 tablet, Rfl: 1  Review of Systems:  Negative unless indicated in HPI.   Physical Exam: Vitals:   06/07/23 1053  BP: 120/78  Pulse: 64  Temp: 98.2 F (36.8 C)  TempSrc: Oral  SpO2: 96%  Weight: 206 lb 11.2 oz (93.8 kg)    Body mass index is 37.2 kg/m.   Physical Exam Vitals reviewed.  Constitutional:      Appearance: Normal appearance.  HENT:     Head: Normocephalic and atraumatic.  Eyes:  Conjunctiva/sclera: Conjunctivae normal.     Pupils: Pupils are equal, round, and reactive to light.  Cardiovascular:     Rate and Rhythm: Normal rate and regular rhythm.  Pulmonary:     Effort: Pulmonary effort is normal.     Breath sounds: Normal breath sounds.  Skin:    General: Skin is warm and dry.  Neurological:     General: No focal deficit present.     Mental Status: She is alert and oriented to person, place, and time.  Psychiatric:        Mood and Affect: Mood normal.        Behavior: Behavior normal.        Thought Content: Thought content normal.        Judgment: Judgment normal.      Impression and Plan:  Type 2 diabetes mellitus with other specified complication, without long-term current use of insulin  (HCC) Assessment & Plan: Excellent control with an A1c of 6.4, continue current.  Orders: -     POCT glycosylated hemoglobin (Hb A1C)  Hypothyroidism following radioiodine therapy Assessment & Plan: Continue levothyroxine, check TSH next visit.   Mixed hyperlipidemia Assessment & Plan: On atorvastatin 40 mg.  Check lipids next visit.   Morbid obesity (HCC) Assessment & Plan: -Discussed healthy lifestyle, including increased physical activity and better food choices to promote weight loss.    Hypertension associated with diabetes [facility]) Assessment & Plan: Well-controlled on current.   Immunization due  -Flu vaccine administered today.   Time spent:33 minutes reviewing chart, interviewing and examining patient and formulating plan of care.     Chaya Jan, MD [facility]

## 2023-06-07 NOTE — Assessment & Plan Note (Signed)
Excellent control with an A1c of 6.4, continue current.

## 2023-06-14 ENCOUNTER — Ambulatory Visit (INDEPENDENT_AMBULATORY_CARE_PROVIDER_SITE_OTHER): Payer: Medicaid Other | Admitting: Dermatology

## 2023-06-14 ENCOUNTER — Encounter: Payer: Self-pay | Admitting: Dermatology

## 2023-06-14 VITALS — BP 121/73 | HR 71

## 2023-06-14 DIAGNOSIS — W908XXA Exposure to other nonionizing radiation, initial encounter: Secondary | ICD-10-CM | POA: Diagnosis not present

## 2023-06-14 DIAGNOSIS — L814 Other melanin hyperpigmentation: Secondary | ICD-10-CM | POA: Diagnosis not present

## 2023-06-14 DIAGNOSIS — L821 Other seborrheic keratosis: Secondary | ICD-10-CM

## 2023-06-14 DIAGNOSIS — D2362 Other benign neoplasm of skin of left upper limb, including shoulder: Secondary | ICD-10-CM

## 2023-06-14 DIAGNOSIS — D045 Carcinoma in situ of skin of trunk: Secondary | ICD-10-CM | POA: Diagnosis not present

## 2023-06-14 DIAGNOSIS — D229 Melanocytic nevi, unspecified: Secondary | ICD-10-CM

## 2023-06-14 DIAGNOSIS — Z1283 Encounter for screening for malignant neoplasm of skin: Secondary | ICD-10-CM | POA: Diagnosis not present

## 2023-06-14 DIAGNOSIS — L578 Other skin changes due to chronic exposure to nonionizing radiation: Secondary | ICD-10-CM

## 2023-06-14 DIAGNOSIS — D492 Neoplasm of unspecified behavior of bone, soft tissue, and skin: Secondary | ICD-10-CM

## 2023-06-14 DIAGNOSIS — D1801 Hemangioma of skin and subcutaneous tissue: Secondary | ICD-10-CM

## 2023-06-14 DIAGNOSIS — L720 Epidermal cyst: Secondary | ICD-10-CM

## 2023-06-14 NOTE — Patient Instructions (Addendum)
Patient Handout: Wound Care for Skin Biopsy Site  Taking Care of Your Skin Biopsy Site  Proper care of the biopsy site is essential for promoting healing and minimizing scarring. This handout provides instructions on how to care for your biopsy site to ensure optimal recovery.  1. Cleaning the Wound:  Clean the biopsy site daily with gentle soap and water. Gently pat the area dry with a clean, soft towel. Avoid harsh scrubbing or rubbing the area, as this can irritate the skin and delay healing.  2. Applying Aquaphor and Bandage:  After cleaning the wound, apply a thin layer of Aquaphor ointment to the biopsy site. Cover the area with a sterile bandage to protect it from dirt, bacteria, and friction. Change the bandage daily or as needed if it becomes soiled or wet.  3. Continued Care for One Week:  Repeat the cleaning, Aquaphor application, and bandaging process daily for one week following the biopsy procedure. Keeping the wound clean and moist during this initial healing period will help prevent infection and promote optimal healing.  4. Massaging Aquaphor into the Area:  ---After one week, discontinue the use of bandages but continue to apply Aquaphor to the biopsy site. ----Gently massage the Aquaphor into the area using circular motions. ---Massaging the skin helps to promote circulation and prevent the formation of scar tissue.   Additional Tips:  Avoid exposing the biopsy site to direct sunlight during the healing process, as this can cause hyperpigmentation or worsen scarring. If you experience any signs of infection, such as increased redness, swelling, warmth, or drainage from the wound, contact your healthcare provider immediately. Follow any additional instructions provided by your healthcare provider for caring for the biopsy site and managing any discomfort. Conclusion:  Taking proper care of your skin biopsy site is crucial for ensuring optimal healing and  minimizing scarring. By following these instructions for cleaning, applying Aquaphor, and massaging the area, you can promote a smooth and successful recovery. If you have any questions or concerns about caring for your biopsy site, don't hesitate to contact your healthcare provider for guidance.       Skin Education :   I counseled the patient regarding the following: Sun screen (SPF 30 or greater) should be applied during peak UV exposure (between 10am and 2pm) and reapplied after exercise or swimming.  The ABCDEs of melanoma were reviewed with the patient, and the importance of monthly self-examination of moles was emphasized. Should any moles change in shape or color, or itch, bleed or burn, pt will contact our office for evaluation sooner then their interval appointment.  Plan: Sunscreen Recommendations I recommended a broad spectrum sunscreen with a SPF of 30 or higher. I explained that SPF 30 sunscreens block approximately 97 percent of the sun's harmful rays. Sunscreens should be applied at least 15 minutes prior to expected sun exposure and then every 2 hours after that as long as sun exposure continues. If swimming or exercising sunscreen should be reapplied every 45 minutes to an hour after getting wet or sweating. One ounce, or the equivalent of a shot glass full of sunscreen, is adequate to protect the skin not covered by a bathing suit. I also recommended a lip balm with a sunscreen as well. Sun protective clothing can be used in lieu of sunscreen but must be worn the entire time you are exposed to the sun's rays.     Important Information  Due to recent changes in healthcare laws, you may see results  of your pathology and/or laboratory studies on MyChart before the doctors have had a chance to review them. We understand that in some cases there may be results that are confusing or concerning to you. Please understand that not all results are received at the same time and often the  doctors may need to interpret multiple results in order to provide you with the best plan of care or course of treatment. Therefore, we ask that you please give [place] 2 business days to thoroughly review all your results before contacting the office for clarification. Should we see a critical lab result, you will be contacted sooner.   If You Need Anything After Your Visit  If you have any questions or concerns for your doctor, please call our main line at [phone] If no one answers, please leave a voicemail as directed and we will return your call as soon as possible. Messages left after 4 pm will be answered the following business day.   You may also send [place] a message via MyChart. We typically respond to MyChart messages within 1-2 business days.  For prescription refills, please ask your pharmacy to contact our office. Our fax number is [phone].  If you have an urgent issue when the clinic is closed that cannot wait until the next business day, you can page your doctor at the number below.    Please note that while we do our best to be available for urgent issues outside of office hours, we are not available 24/7.   If you have an urgent issue and are unable to reach [place], you may choose to seek medical care at your doctor's office, retail clinic, urgent care center, or emergency room.  If you have a medical emergency, please immediately call 911 or go to the emergency department. In the event of inclement weather, please call our main line at [phone] for an update on the status of any delays or closures.  Dermatology Medication Tips: Please keep the boxes that topical medications come in in order to help keep track of the instructions about where and how to use these. Pharmacies typically print the medication instructions only on the boxes and not directly on the medication tubes.   If your medication is too expensive, please contact our office at [phone] or send [place] a message  through MyChart.   We are unable to tell what your co-pay for medications will be in advance as this is different depending on your insurance coverage. However, we may be able to find a substitute medication at lower cost or fill out paperwork to get insurance to cover a needed medication.   If a prior authorization is required to get your medication covered by your insurance company, please allow [place] 1-2 business days to complete this process.  Drug prices often vary depending on where the prescription is filled and some pharmacies may offer cheaper prices.  The website www.goodrx.com contains coupons for medications through different pharmacies. The prices here do not account for what the cost may be with help from insurance (it may be cheaper with your insurance), but the website can give you the price if you did not use any insurance.  - You can print the associated coupon and take it with your prescription to the pharmacy.  - You may also stop by our office during regular business hours and pick up a GoodRx coupon card.  - If you need your prescription sent electronically to a different pharmacy, notify our office  through [facility] or by phone at [phone]

## 2023-06-14 NOTE — Progress Notes (Signed)
New Patient Visit   Subjective  Raven Mendoza is a 62 y.o. female who presents for the following: Skin Cancer Screening and Full Body Skin Exam. First time seeing a dermatologist. No personal or family history of skin cancer or dysplastic nevi. Has spent a lot of time out in the sun. 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   Has noticed spots on right temple and right upper forehead. Non tender, denies itching, denies bleeding.   The patient presents for Total-Body Skin Exam (TBSE) for skin cancer screening and mole check. The patient has spots, moles and lesions to be evaluated, some may be new or changing and the patient may have concern these could be cancer.  The following portions of the chart were reviewed this encounter and updated as appropriate: medications, allergies, medical history  Review of Systems:  No other skin or systemic complaints except as noted in HPI or Assessment and Plan.  Objective  Well appearing patient in no apparent distress; mood and affect are within normal limits.  A full examination was performed including scalp, head, eyes, ears, nose, lips, neck, chest, axillae, abdomen, back, buttocks, bilateral upper extremities, bilateral lower extremities, hands, feet, fingers, toes, fingernails, and toenails. All findings within normal limits unless otherwise noted below.   Relevant physical exam findings are noted in the Assessment and Plan.  upper mid chest Pink patch       Left Lateral Upper Arm Irregular brown papule        Assessment & Plan   SKIN CANCER SCREENING PERFORMED TODAY.  ACTINIC DAMAGE - Chronic condition, secondary to cumulative UV/sun exposure - diffuse scaly erythematous macules with underlying dyspigmentation - Recommend daily broad spectrum sunscreen SPF 30+ to sun-exposed areas, reapply every 2 hours as needed.  - Staying in the shade or wearing long sleeves, sun glasses (UVA+UVB protection) and wide brim hats  (4-inch brim around the entire circumference of the hat) are also recommended for sun protection.  - Call for new or changing lesions.  LENTIGINES, SEBORRHEIC KERATOSES, HEMANGIOMAS - Benign normal skin lesions - Benign-appearing - Call for any changes  MELANOCYTIC NEVI - Tan-brown and/or pink-flesh-colored symmetric macules and papules - Benign appearing on exam today - Observation - Call clinic for new or changing moles - Recommend daily use of broad spectrum spf 30+ sunscreen to sun-exposed areas.   SEBORRHEIC KERATOSIS - Stuck-on, waxy, tan-brown papules at right temple and right upper forehead.  - Benign-appearing - Discussed benign etiology and prognosis. - Observe - Call for any changes  EPIDERMAL INCLUSION CYST Exam: Subcutaneous nodule at anterior left upper arm  Benign-appearing. Exam most consistent with an epidermal inclusion cyst. Discussed that a cyst is a benign growth that can grow over time and sometimes get irritated or inflamed. Recommend observation if it is not bothersome. Discussed option of surgical excision to remove it if it is growing, symptomatic, or other changes noted. Please call for new or changing lesions so they can be evaluated.  Neoplasm of skin (2) upper mid chest  Skin / nail biopsy Type of biopsy: tangential   Informed consent: discussed and consent obtained   Timeout: patient name, date of birth, surgical site, and procedure verified   Procedure prep:  Patient was prepped and draped in usual sterile fashion Prep type:  Isopropyl alcohol Anesthesia: the lesion was anesthetized in a standard fashion   Anesthetic:  1% lidocaine w/ epinephrine 1-100,000 buffered w/ 8.4% NaHCO3 Instrument used: DermaBlade   Hemostasis  achieved with: pressure and aluminum chloride   Outcome: patient tolerated procedure well   Post-procedure details: sterile dressing applied and wound care instructions given   Dressing type: bandage and petrolatum    Specimen  1 - Surgical pathology Differential Diagnosis: NMSC vs amelanotic M vs other  Check Margins: No  Left Lateral Upper Arm  Skin / nail biopsy Type of biopsy: tangential   Informed consent: discussed and consent obtained   Timeout: patient name, date of birth, surgical site, and procedure verified   Procedure prep:  Patient was prepped and draped in usual sterile fashion Prep type:  Isopropyl alcohol Anesthesia: the lesion was anesthetized in a standard fashion   Anesthetic:  1% lidocaine w/ epinephrine 1-100,000 buffered w/ 8.4% NaHCO3 Instrument used: DermaBlade   Hemostasis achieved with: pressure and aluminum chloride   Outcome: patient tolerated procedure well   Post-procedure details: sterile dressing applied and wound care instructions given   Dressing type: bandage and petrolatum    Specimen 2 - Surgical pathology Differential Diagnosis: DF vs NMSC vs scar vs other  Check Margins: No    Return for TBSE in 2-3 years.  I, Lawson Radar, CMA, am acting as scribe for Gwenith Daily, MD.   Documentation: I have reviewed the above documentation for accuracy and completeness, and I agree with the above.  Gwenith Daily, MD

## 2023-06-18 LAB — SURGICAL PATHOLOGY

## 2023-06-19 ENCOUNTER — Telehealth: Payer: Self-pay

## 2023-06-19 DIAGNOSIS — Z419 Encounter for procedure for purposes other than remedying health state, unspecified: Secondary | ICD-10-CM | POA: Diagnosis not present

## 2023-06-19 NOTE — Telephone Encounter (Signed)
Spoke with pt and gave her biopsy results and recommendations for mohs

## 2023-06-19 NOTE — Telephone Encounter (Signed)
-----   Message from [facility] sent at 06/19/2023  9:38 AM EDT ----- 1. SCCIS- chest, Mohs with me 2. DF- left lateral arm- benign   Please call patient to discuss diagnosis and schedule for Mohs surgery.

## 2023-06-21 ENCOUNTER — Other Ambulatory Visit: Payer: Self-pay | Admitting: Internal Medicine

## 2023-06-21 DIAGNOSIS — I10 Essential (primary) hypertension: Secondary | ICD-10-CM

## 2023-07-10 ENCOUNTER — Encounter: Payer: Self-pay | Admitting: Dermatology

## 2023-07-10 ENCOUNTER — Ambulatory Visit: Payer: Medicaid Other | Admitting: Dermatology

## 2023-07-10 VITALS — BP 136/76 | HR 69 | Temp 98.0°F

## 2023-07-10 DIAGNOSIS — C44529 Squamous cell carcinoma of skin of other part of trunk: Secondary | ICD-10-CM

## 2023-07-10 DIAGNOSIS — L579 Skin changes due to chronic exposure to nonionizing radiation, unspecified: Secondary | ICD-10-CM | POA: Diagnosis not present

## 2023-07-10 DIAGNOSIS — W908XXA Exposure to other nonionizing radiation, initial encounter: Secondary | ICD-10-CM

## 2023-07-10 DIAGNOSIS — C4492 Squamous cell carcinoma of skin, unspecified: Secondary | ICD-10-CM

## 2023-07-10 DIAGNOSIS — L814 Other melanin hyperpigmentation: Secondary | ICD-10-CM | POA: Diagnosis not present

## 2023-07-10 NOTE — Progress Notes (Signed)
Follow-Up Visit   Subjective  Raven Mendoza is a 63 y.o. female who presents for the following: Mohs of upper mid chest  The following portions of the chart were reviewed this encounter and updated as appropriate: medications, allergies, medical history  Review of Systems:  No other skin or systemic complaints except as noted in HPI or Assessment and Plan.  Objective  Well appearing patient in no apparent distress; mood and affect are within normal limits.  A focused examination was performed of the following areas: Upper mid chest Relevant physical exam findings are noted in the Assessment and Plan.   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 No   Was the anticoagulation regimen changed prior to Mohs? No    Procedure Details: Surgical site (from skin exam): upper mid chest Pre-operative length (cm): 2.1 Pre-operative width (cm): 1.6  Micrographic Surgery Details: Post-operative length (cm): 2.8 Post-operative width (cm): 2.2 Number of Mohs stages: 1  Skin repair  Final length (cm):  5.2 Fine/surface layer approximation (top stitches):  Hemostasis achieved with: suture Hemostasis achieved with comment:  4.0 monycryl w dermabond    Return in about 4 weeks (around 08/07/2023).  Owens Shark, CMA, am acting as scribe for Gwenith Daily, MD.    07/10/2023  HISTORY OF PRESENT ILLNESS  Raven Mendoza is seen in consultation at the request of Dr. 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 They note that the area has been present for about 1 year increasing in size with redness.  There is no history of previous treatment.  Reports no other new or changing lesions and has no other complaints today.  Medications and allergies:  see patient chart.  Review of systems: Reviewed 8 systems and notable for the above skin cancer.  All other systems reviewed are unremarkable/negative, unless noted in the HPI. Past medical history, surgical history, family history, social history were also reviewed and are noted in the chart/questionnaire.    PHYSICAL EXAMINATION  General: Well-appearing, in no acute distress, alert and oriented x 4. Vitals reviewed in chart (if available).   Skin: Exam reveals a 2.1 x 1.6 cm erythematous papule and biopsy scar on the mid chest. There are rhytids, telangiectasias, and lentigines, consistent with photodamage.   Biopsy report(s) reviewed, confirming the diagnosis.  ASSESSMENT  1) Squamous Cell Carcinoma in Situ 2) photodamage 3) solar lentigines   PLAN   1. Due to location, size, histology, or recurrence and the likelihood of subclinical extension as well as the need to conserve normal surrounding tissue, the patient was deemed acceptable for Mohs micrographic surgery (MMS).  The nature and purpose of the procedure, associated benefits and risks including recurrence and scarring, possible complications such as pain, infection, and bleeding, and alternative methods of treatment if appropriate were discussed with the patient during consent. The lesion location was verified by the patient, by reviewing previous notes, pathology reports, and by photographs as well as angulation measurements if available.  Informed consent was reviewed and signed by the patient, and timeout was performed at 9:15 AM. See op note below.  2. For the photodamage and solar lentigines, sun protection discussed/information given on OTC sunscreens, and we recommend continued regular follow-up with primary dermatologist every 6  months or sooner for any growing, bleeding, or changing lesions. 3. Prognosis and future surveillance discussed. 4. Letter with treatment outcome sent to referring provider. 5. Pain  acetaminophen/ibuprofen  MOHS MICROGRAPHIC SURGERY AND RECONSTRUCTION  Initial size:   2.1 x 1.6 cm Surgical defect/wound size: 2.8 x 2.2 cm Anesthesia:    0.33% lidocaine with 1:200,000 epinephrine EBL:    <5 mL Complications:  None Repair type:   Intermediate SQ suture:   4-0 Monocryl Cutaneous suture:  Dermabond Final size of the repair: 5.2 cm  Stages: 1  STAGE I: Anesthesia achieved with 0.5% lidocaine with 1:200,000 epinephrine. ChloraPrep applied. 1 section(s) excised using Mohs technique (this includes total peripheral and deep tissue margin excision and evaluation with frozen sections, excised and interpreted by the same physician). The tumor was first debulked and then excised with an approx. 2mm margin.  Hemostasis was achieved with electrocautery as needed.  The specimen was then oriented, subdivided/relaxed, inked, and processed using Mohs technique.    Frozen section analysis revealed a clear deep and peripheral margin.   Reconstruction  The surgical wound was then cleaned, prepped, and re-anesthetized as above. Wound edges were undermined minimally along at least one entire edge and at a distance equal to or greater than the width of the defect (see wound defect size above) in order to achieve closure and decrease wound tension and anatomic distortion. Redundant tissue repair including standing cone removal was performed. Hemostasis was achieved with electrocautery. Subcutaneous and epidermal tissues were approximated with the above sutures. The surgical site was then lightly scrubbed with sterile, saline-soaked gauze. Steri-strips were applied, and the area was then bandaged using Vaseline ointment, non-adherent gauze, gauze pads, and tape to provide an adequate pressure dressing. The patient tolerated the procedure well, was given detailed written and verbal wound care instructions, and was discharged in good condition.   The patient will follow-up: 4 weeks.  Options for  management of the wound were discussed. Given the location and size/depth of the wound, Primary repair planned.  Burow's triangles planned to follow relaxed skin tension lines.  Anesthesia achieved, chloraprep applied and a sterile drape placed.  Burow's triangles were excised.  Undermining carried out minimally in a subcutaneous plane to facilitate tension-free closure.  Layered closure completed using 4.0 Monocryl for buried vertical mattress sutures followed by Dermabond.   Documentation: I have reviewed the above documentation for accuracy and completeness, and I agree with the above.  Gwenith Daily, MD

## 2023-07-10 NOTE — Patient Instructions (Signed)

## 2023-07-17 ENCOUNTER — Other Ambulatory Visit: Payer: Self-pay | Admitting: "Endocrinology

## 2023-07-17 DIAGNOSIS — E890 Postprocedural hypothyroidism: Secondary | ICD-10-CM

## 2023-07-20 DIAGNOSIS — Z419 Encounter for procedure for purposes other than remedying health state, unspecified: Secondary | ICD-10-CM | POA: Diagnosis not present

## 2023-08-09 ENCOUNTER — Encounter: Payer: Self-pay | Admitting: Dermatology

## 2023-08-09 ENCOUNTER — Ambulatory Visit: Payer: Medicaid Other | Admitting: Dermatology

## 2023-08-09 DIAGNOSIS — L905 Scar conditions and fibrosis of skin: Secondary | ICD-10-CM

## 2023-08-09 DIAGNOSIS — Z85828 Personal history of other malignant neoplasm of skin: Secondary | ICD-10-CM

## 2023-08-09 DIAGNOSIS — C4492 Squamous cell carcinoma of skin, unspecified: Secondary | ICD-10-CM

## 2023-08-09 NOTE — Patient Instructions (Signed)
Important Information  Due to recent changes in healthcare laws, you may see results of your pathology and/or laboratory studies on MyChart before the doctors have had a chance to review them. We understand that in some cases there may be results that are confusing or concerning to you. Please understand that not all results are received at the same time and often the doctors may need to interpret multiple results in order to provide you with the best plan of care or course of treatment. Therefore, we ask that you please give [place] 2 business days to thoroughly review all your results before contacting the office for clarification. Should we see a critical lab result, you will be contacted sooner.   If You Need Anything After Your Visit  If you have any questions or concerns for your doctor, please call our main line at [phone] If no one answers, please leave a voicemail as directed and we will return your call as soon as possible. Messages left after 4 pm will be answered the following business day.   You may also send [place] a message via MyChart. We typically respond to MyChart messages within 1-2 business days.  For prescription refills, please ask your pharmacy to contact our office. Our fax number is [phone].  If you have an urgent issue when the clinic is closed that cannot wait until the next business day, you can page your doctor at the number below.    Please note that while we do our best to be available for urgent issues outside of office hours, we are not available 24/7.   If you have an urgent issue and are unable to reach [place], you may choose to seek medical care at your doctor's office, retail clinic, urgent care center, or emergency room.  If you have a medical emergency, please immediately call 911 or go to the emergency department. In the event of inclement weather, please call our main line at [phone] for an update on the status of any delays or  closures.  Dermatology Medication Tips: Please keep the boxes that topical medications come in in order to help keep track of the instructions about where and how to use these. Pharmacies typically print the medication instructions only on the boxes and not directly on the medication tubes.   If your medication is too expensive, please contact our office at [phone] or send [place] a message through MyChart.   We are unable to tell what your co-pay for medications will be in advance as this is different depending on your insurance coverage. However, we may be able to find a substitute medication at lower cost or fill out paperwork to get insurance to cover a needed medication.   If a prior authorization is required to get your medication covered by your insurance company, please allow [place] 1-2 business days to complete this process.  Drug prices often vary depending on where the prescription is filled and some pharmacies may offer cheaper prices.  The website www.goodrx.com contains coupons for medications through different pharmacies. The prices here do not account for what the cost may be with help from insurance (it may be cheaper with your insurance), but the website can give you the price if you did not use any insurance.  - You can print the associated coupon and take it with your prescription to the pharmacy.  - You may also stop by our office during regular business hours and pick up a GoodRx coupon card.  - If  you need your prescription sent electronically to a different pharmacy, notify our office through [facility] or by phone at [phone]    Skin Education :   I counseled the patient regarding the following: Sun screen (SPF 30 or greater) should be applied during peak UV exposure (between 10am and 2pm) and reapplied after exercise or swimming.  The ABCDEs of melanoma were reviewed with the patient, and the importance of monthly self-examination of moles was emphasized.  Should any moles change in shape or color, or itch, bleed or burn, pt will contact our office for evaluation sooner then their interval appointment.  Plan: Sunscreen Recommendations I recommended a broad spectrum sunscreen with a SPF of 30 or higher. I explained that SPF 30 sunscreens block approximately 97 percent of the sun's harmful rays. Sunscreens should be applied at least 15 minutes prior to expected sun exposure and then every 2 hours after that as long as sun exposure continues. If swimming or exercising sunscreen should be reapplied every 45 minutes to an hour after getting wet or sweating. One ounce, or the equivalent of a shot glass full of sunscreen, is adequate to protect the skin not covered by a bathing suit. I also recommended a lip balm with a sunscreen as well. Sun protective clothing can be used in lieu of sunscreen but must be worn the entire time you are exposed to the sun's rays.

## 2023-08-09 NOTE — Progress Notes (Signed)
   Follow-Up Visit   Subjective  Raven Mendoza is a 62 y.o. 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 She states she feels it has healed well. It was initially itchy but has subsided   The following portions of the chart were reviewed this encounter and updated as appropriate: medications, allergies, medical history  Review of Systems:  No other skin or systemic complaints except as noted in HPI or Assessment and Plan.  Objective  Well appearing patient in no apparent distress; mood and affect are within normal limits.  A focused examination was performed of the following areas:  Upper mid chest.  Relevant exam findings are noted in the Assessment and Plan.   Assessment & Plan    Scar s/p Mohs for SCC, treated on 06/14/2023, repaired with a linear closure - Reassured that wound has healed well - Discussed that scars take up to 12 months to mature from the date of surgery - Recommend SPF 30+ to scar daily to prevent purple color - OK to start scar massage at 4-6 weeks post-op - Can consider silicone based products for scar healing   Return if symptoms worsen or fail to improve.  I, Tillie Fantasia, CMA, am acting as scribe for Gwenith Daily, MD.   Documentation: I have reviewed the above documentation for accuracy and completeness, and I agree with the above.  Gwenith Daily, MD

## 2023-09-11 ENCOUNTER — Other Ambulatory Visit: Payer: Self-pay | Admitting: Internal Medicine

## 2023-09-11 DIAGNOSIS — E1169 Type 2 diabetes mellitus with other specified complication: Secondary | ICD-10-CM

## 2023-09-27 ENCOUNTER — Ambulatory Visit: Payer: Medicaid Other | Admitting: "Endocrinology

## 2023-09-30 DIAGNOSIS — Z419 Encounter for procedure for purposes other than remedying health state, unspecified: Secondary | ICD-10-CM | POA: Diagnosis not present

## 2023-10-08 ENCOUNTER — Encounter: Payer: Medicaid Other | Admitting: Internal Medicine

## 2023-10-20 DIAGNOSIS — Z419 Encounter for procedure for purposes other than remedying health state, unspecified: Secondary | ICD-10-CM | POA: Diagnosis not present

## 2023-11-17 DIAGNOSIS — Z419 Encounter for procedure for purposes other than remedying health state, unspecified: Secondary | ICD-10-CM | POA: Diagnosis not present

## 2023-11-20 ENCOUNTER — Other Ambulatory Visit: Payer: Self-pay | Admitting: Internal Medicine

## 2023-11-20 DIAGNOSIS — M150 Primary generalized (osteo)arthritis: Secondary | ICD-10-CM

## 2023-11-29 DIAGNOSIS — E890 Postprocedural hypothyroidism: Secondary | ICD-10-CM | POA: Diagnosis not present

## 2023-11-29 DIAGNOSIS — E782 Mixed hyperlipidemia: Secondary | ICD-10-CM | POA: Diagnosis not present

## 2023-11-30 LAB — T4, FREE: Free T4: 1.65 ng/dL (ref 0.82–1.77)

## 2023-11-30 LAB — LIPID PANEL
Chol/HDL Ratio: 1.9 ratio (ref 0.0–4.4)
Cholesterol, Total: 187 mg/dL (ref 100–199)
HDL: 97 mg/dL (ref >39)
LDL Chol Calc (NIH): 80 mg/dL (ref 0–99)
Triglycerides: 51 mg/dL (ref 0–149)
VLDL Cholesterol Cal: 10 mg/dL (ref 5–40)

## 2023-11-30 LAB — TSH: TSH: 1.42 u[IU]/mL (ref 0.450–4.500)

## 2023-12-03 ENCOUNTER — Other Ambulatory Visit: Payer: Self-pay | Admitting: *Deleted

## 2023-12-03 DIAGNOSIS — I10 Essential (primary) hypertension: Secondary | ICD-10-CM

## 2023-12-03 MED ORDER — AMLODIPINE BESYLATE 5 MG PO TABS: 5.0000 mg | ORAL_TABLET | Freq: Every day | ORAL | 1 refills | Status: DC

## 2023-12-05 ENCOUNTER — Ambulatory Visit (INDEPENDENT_AMBULATORY_CARE_PROVIDER_SITE_OTHER): Payer: Medicaid Other | Admitting: "Endocrinology

## 2023-12-05 ENCOUNTER — Encounter: Payer: Self-pay | Admitting: "Endocrinology

## 2023-12-05 VITALS — BP 118/66 | HR 72 | Ht 62.0 in | Wt 210.6 lb

## 2023-12-05 DIAGNOSIS — Z7984 Long term (current) use of oral hypoglycemic drugs: Secondary | ICD-10-CM

## 2023-12-05 DIAGNOSIS — E782 Mixed hyperlipidemia: Secondary | ICD-10-CM | POA: Diagnosis not present

## 2023-12-05 DIAGNOSIS — E66812 Obesity, class 2: Secondary | ICD-10-CM | POA: Insufficient documentation

## 2023-12-05 DIAGNOSIS — E559 Vitamin D deficiency, unspecified: Secondary | ICD-10-CM

## 2023-12-05 DIAGNOSIS — E890 Postprocedural hypothyroidism: Secondary | ICD-10-CM | POA: Diagnosis not present

## 2023-12-05 DIAGNOSIS — E119 Type 2 diabetes mellitus without complications: Secondary | ICD-10-CM | POA: Diagnosis not present

## 2023-12-05 DIAGNOSIS — E1165 Type 2 diabetes mellitus with hyperglycemia: Secondary | ICD-10-CM

## 2023-12-05 NOTE — Progress Notes (Signed)
 12/05/2023  Endocrinology follow-up note   Subjective:    Patient ID: Raven Mendoza, female    DOB: 08/26/1961, PCP Philip Aspen, Limmie Patricia, MD   Past Medical History:  Diagnosis Date   Allergy to bee sting 04/20/2008   Anaphylaxis   Arthritis    Hyperthyroidism    Hypothyroidism    Obesity (BMI 35.0-39.9 without comorbidity)    Osteoarthritis    Vitamin D deficiency    Past Surgical History:  Procedure Laterality Date   c sections  1987 and 1990   COLONOSCOPY  03/24/2004   Dr.Johnson normal excellent prep   KNEE CLOSED REDUCTION  03/26/2012   Procedure: CLOSED MANIPULATION KNEE;  Surgeon: Shelda Pal, MD;  Location: [facility];  Service: Orthopedics;  Laterality: Left;   radioactive iodine tx for thyroid  4 yrs ago,and  5 yrs ago   REVISION TOTAL HIP ARTHROPLASTY Left 2019   TOTAL HIP ARTHROPLASTY  10/24/2011   right   TOTAL KNEE ARTHROPLASTY  01/16/2012   left   TOTAL KNEE ARTHROPLASTY  03/26/2012   Procedure: TOTAL KNEE ARTHROPLASTY;  Surgeon: Shelda Pal, MD;  Location: [facility];  Service: Orthopedics;  Laterality: Right;   Social History   Socioeconomic History   Marital status: Married    Spouse name: Not on file   Number of children: Not on file   Years of education: Not on file   Highest education level: Not on file  Occupational History   Not on file  Tobacco Use   Smoking status: Never   Smokeless tobacco: Never  Vaping Use   Vaping status: Never Used  Substance and Sexual Activity   Alcohol use: No   Drug use: No   Sexual activity: Not on file  Other Topics Concern   Not on file  Social History Narrative   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    She is active and involved with the farm.   Social Drivers of Corporate investment banker Strain: Not on file  Food Insecurity: Not on file  Transportation Needs: Not on file  Physical Activity: Not on file  Stress: Not on file  Social Connections: Not on file   Outpatient Encounter Medications  as of 12/05/2023  Medication Sig   amLODipine (NORVASC) 5 MG tablet Take 1 tablet (5 mg total) by mouth daily.   atorvastatin (LIPITOR) 40 MG tablet Take 1 tablet (40 mg total) by mouth daily.   Cholecalciferol (VITAMIN D) 50 MCG (2000 UT) CAPS Take 2,000 Units by mouth daily with breakfast.   levothyroxine (SYNTHROID) 112 MCG tablet TAKE 1 TABLET DAILY BEFORE BREAKFAST   meloxicam (MOBIC) 15 MG tablet TAKE 1 TABLET DAILY AS NEEDED FOR PAIN   metFORMIN (GLUCOPHAGE) 1000 MG tablet TAKE 1 TABLET(1000 MG) BY MOUTH DAILY WITH BREAKFAST   No facility-administered encounter medications on file as of 12/05/2023.   ALLERGIES: Allergies  Allergen Reactions   Penicillins Swelling    Pt reports remembers hand swelling, but not sure what else swelled up. Reports reaction was as a child.    VACCINATION STATUS: Immunization History  Administered Date(s) Administered   Influenza, Seasonal, Injecte, Preservative Fre 06/07/2023   Influenza,inj,Quad PF,6+ Mos 08/01/2019, 05/17/2021, 07/17/2022   Td 08/12/2002   Tdap 12/03/2013   Zoster Recombinant(Shingrix) 02/11/2021, 05/17/2021    HPI  63 year old female patient with medical history as above. 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  - Review of her medical records show that  she did receive I-131 therapy on 2 occasions (18.2 mCi on 09/19/2005 and 26.1 mCi on 04/04/2006 due to refractory Graves' disease). - She was started on thyroid hormone replacements which was adjusted several times over the years.  She is currently on levothyroxine 112 mcg p.o. daily before breakfast.  Her previsit thyroid function tests are consistent with appropriate replacement.          She reports compliance to this medication. She has no new coplaints. -She has hyperlipidemia currently on Lipitor 20 mg p.o. nightly.    She denies heat nor cold intolerance. She denies dysphagia, shortness of breath, voice change. She has significant family history of  various thyroid dysfunctions. She denies any family history of thyroid cancer. -She has steady weight since last visit.   She was recently diagnosed with type 2 diabetes, currently on 1000 mg of metformin daily.  She continues to tolerate.  Her recent A1c was 6.4%.   Review of Systems Limited as above.  Objective:    BP 118/66   Pulse 72   Ht 5\' 2"  (1.575 m)   Wt 210 lb 9.6 oz (95.5 kg)   LMP 10/23/2008   BMI 38.52 kg/m   Wt Readings from Last 3 Encounters:  12/05/23 210 lb 9.6 oz (95.5 kg)  06/07/23 206 lb 11.2 oz (93.8 kg)  03/27/23 209 lb 6.4 oz (95 kg)    Physical Exam    CMP ( most recent) CMP     Component Value Date/Time   NA 137 07/17/2022 0755   K 4.0 07/17/2022 0755   CL 102 07/17/2022 0755   CO2 25 07/17/2022 0755   GLUCOSE 117 (H) 07/17/2022 0755   BUN 23 07/17/2022 0755   CREATININE 0.66 07/17/2022 0755   CREATININE 0.67 08/01/2019 1431   CALCIUM 9.6 07/17/2022 0755   PROT 7.7 07/17/2022 0755   ALBUMIN 4.4 07/17/2022 0755   AST 19 07/17/2022 0755   ALT 15 07/17/2022 0755   ALKPHOS 76 07/17/2022 0755   BILITOT 0.7 07/17/2022 0755   GFRNONAA 85 11/10/2016 0822   GFRAA >89 11/10/2016 0822    Lipid Panel     Component Value Date/Time   CHOL 187 11/29/2023 1148   TRIG 51 11/29/2023 1148   HDL 97 11/29/2023 1148   CHOLHDL 1.9 11/29/2023 1148   CHOLHDL 2 12/06/2022 1012   VLDL 10.4 12/06/2022 1012   LDLCALC 80 11/29/2023 1148   Recent Results (from the past 2160 hours)  Lipid panel     Status: None   Collection Time: 11/29/23 11:48 AM  Result Value Ref Range   Cholesterol, Total 187 100 - 199 mg/dL   Triglycerides 51 0 - 149 mg/dL   HDL 97 >40 mg/dL   VLDL Cholesterol Cal 10 5 - 40 mg/dL   LDL Chol Calc (NIH) 80 0 - 99 mg/dL   Chol/HDL Ratio 1.9 0.0 - 4.4 ratio    Comment:                                   T. Chol/HDL Ratio                                             Men  Women  1/2 Avg.Risk  3.4    3.3                                    Avg.Risk  5.0    4.4                                2X Avg.Risk  9.6    7.1                                3X Avg.Risk 23.4   11.0   TSH     Status: None   Collection Time: 11/29/23 11:48 AM  Result Value Ref Range   TSH 1.420 0.450 - 4.500 uIU/mL  T4, free     Status: None   Collection Time: 11/29/23 11:48 AM  Result Value Ref Range   Free T4 1.65 0.82 - 1.77 ng/dL     Assessment & Plan:   1. 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  Her previsit thyroid function tests are consistent with appropriate replacement.  She is advised to continue Synthroid 112 mcg p.o. daily before breakfast.      - We discussed about the correct intake of her thyroid hormone, on empty stomach at fasting, with water, separated by at least 30 minutes from breakfast and other medications,  and separated by more than 4 hours from calcium, iron, multivitamins, acid reflux medications (PPIs). -Patient is made aware of the fact that thyroid hormone replacement is needed for life, dose to be adjusted by periodic monitoring of thyroid function tests.   - She does not have clinical goiter, no need for thyroid imaging at this time.  2. hyperlipidemia: Her LDL is controlled at 80.  She is advised to continue  atorvastatin 40 mg p.o. nightly.  Side effects and precautions discussed with her.  Whole food plant-based diet was discussed briefly with her.   3. Vitamin D deficiency -Her vitamin D is replete at 27.  She is advised to lower her vitamin D supplement to 2000 units daily.    4) type 2 diabetes: Recent A1c of 6.4%.  She is advised to continue metformin 1000 mg p.o. daily. - she acknowledges that there is a room for improvement in her food and drink choices. - Suggestion is made for her to avoid simple carbohydrates  from her diet including Cakes, Sweet Desserts, Ice Cream, Soda  (diet and regular), Sweet Tea, Candies, Chips, Cookies, Store Bought Juices, Alcohol , Artificial Sweeteners,  Coffee Creamer, and "Sugar-free" Products, Lemonade. This will help patient to have more stable blood glucose profile and potentially avoid unintended weight gain.    - I advised patient to maintain close follow up with Philip Aspen, Limmie Patricia, MD for primary care needs.    I spent  25  minutes in the care of the patient today including review of labs from Thyroid Function, CMP, and other relevant labs ; imaging/biopsy records (current and previous including abstractions from other facilities); face-to-face time discussing  her lab results and symptoms, medications doses, her options of short and long term treatment based on the latest standards of care / guidelines;   and documenting the encounter.  Chales Abrahams Prust  participated in the  discussions, expressed understanding, and voiced agreement with the above plans.  All questions were answered to her satisfaction. she is encouraged to contact clinic should she have any questions or concerns prior to her return visit.    Follow up plan: Return in about 6 months (around 06/06/2024) for F/U with Pre-visit Labs, A1c -NV.  Marquis Lunch, MD Phone: [phone]  Fax: [phone]   12/05/2023, 5:29 PM

## 2023-12-05 NOTE — Patient Instructions (Signed)

## 2023-12-29 DIAGNOSIS — Z419 Encounter for procedure for purposes other than remedying health state, unspecified: Secondary | ICD-10-CM | POA: Diagnosis not present

## 2024-01-14 ENCOUNTER — Other Ambulatory Visit: Payer: Self-pay | Admitting: Internal Medicine

## 2024-01-14 DIAGNOSIS — Z1231 Encounter for screening mammogram for malignant neoplasm of breast: Secondary | ICD-10-CM

## 2024-01-16 ENCOUNTER — Ambulatory Visit
Admission: RE | Admit: 2024-01-16 | Discharge: 2024-01-16 | Discharge status: Home / Self Care | Source: Ambulatory Visit | Attending: Internal Medicine | Admitting: Internal Medicine

## 2024-01-16 DIAGNOSIS — Z1231 Encounter for screening mammogram for malignant neoplasm of breast: Secondary | ICD-10-CM

## 2024-01-27 ENCOUNTER — Other Ambulatory Visit: Payer: Self-pay | Admitting: Internal Medicine

## 2024-01-27 DIAGNOSIS — E782 Mixed hyperlipidemia: Secondary | ICD-10-CM

## 2024-01-28 DIAGNOSIS — Z419 Encounter for procedure for purposes other than remedying health state, unspecified: Secondary | ICD-10-CM | POA: Diagnosis not present

## 2024-02-06 ENCOUNTER — Ambulatory Visit: Payer: Medicaid Other | Admitting: Dermatology

## 2024-02-19 ENCOUNTER — Encounter: Payer: Self-pay | Admitting: Internal Medicine

## 2024-02-19 LAB — HM DIABETES EYE EXAM

## 2024-02-25 ENCOUNTER — Encounter: Payer: Self-pay | Admitting: Dermatology

## 2024-02-25 ENCOUNTER — Ambulatory Visit (INDEPENDENT_AMBULATORY_CARE_PROVIDER_SITE_OTHER): Admitting: Dermatology

## 2024-02-25 DIAGNOSIS — D239 Other benign neoplasm of skin, unspecified: Secondary | ICD-10-CM

## 2024-02-25 DIAGNOSIS — Z8589 Personal history of malignant neoplasm of other organs and systems: Secondary | ICD-10-CM

## 2024-02-25 DIAGNOSIS — Z1283 Encounter for screening for malignant neoplasm of skin: Secondary | ICD-10-CM

## 2024-02-25 DIAGNOSIS — L814 Other melanin hyperpigmentation: Secondary | ICD-10-CM

## 2024-02-25 DIAGNOSIS — L738 Other specified follicular disorders: Secondary | ICD-10-CM

## 2024-02-25 DIAGNOSIS — Z86007 Personal history of in-situ neoplasm of skin: Secondary | ICD-10-CM | POA: Diagnosis not present

## 2024-02-25 DIAGNOSIS — L578 Other skin changes due to chronic exposure to nonionizing radiation: Secondary | ICD-10-CM | POA: Diagnosis not present

## 2024-02-25 DIAGNOSIS — B079 Viral wart, unspecified: Secondary | ICD-10-CM | POA: Diagnosis not present

## 2024-02-25 DIAGNOSIS — D1801 Hemangioma of skin and subcutaneous tissue: Secondary | ICD-10-CM

## 2024-02-25 DIAGNOSIS — W908XXA Exposure to other nonionizing radiation, initial encounter: Secondary | ICD-10-CM

## 2024-02-25 DIAGNOSIS — L905 Scar conditions and fibrosis of skin: Secondary | ICD-10-CM

## 2024-02-25 DIAGNOSIS — L821 Other seborrheic keratosis: Secondary | ICD-10-CM

## 2024-02-25 DIAGNOSIS — D229 Melanocytic nevi, unspecified: Secondary | ICD-10-CM

## 2024-02-25 NOTE — Patient Instructions (Addendum)
 For areas treated with Liquid Nitrogen:  Keep clean with soap and water.  Apply Vaseline or Aquaphor twice daily.  Skin Education :  Paulene Boron screen (SPF 30 or greater) should be applied during peak UV exposure (between 10am and 2pm) and reapplied after exercise or swimming.  Sunscreen Recommendations I recommended a broad spectrum sunscreen with a SPF of 30 or higher. I explained that SPF 30 sunscreens block approximately 97 percent of the sun's harmful rays. Sunscreens should be applied at least 15 minutes prior to expected sun exposure and then every 2 hours after that as long as sun exposure continues. If swimming or exercising sunscreen should be reapplied every 45 minutes to an hour after getting wet or sweating. One ounce, or the equivalent of a shot glass full of sunscreen, is adequate to protect the skin not covered by a bathing suit. I also recommended a lip balm with a sunscreen as well. Sun protective clothing can be used in lieu of sunscreen but must be worn the entire time you are exposed to the sun's rays.   Important Information  Due to recent changes in healthcare laws, you may see results of your pathology and/or laboratory studies on MyChart before the doctors have had a chance to review them. We understand that in some cases there may be results that are confusing or concerning to you. Please understand that not all results are received at the same time and often the doctors may need to interpret multiple results in order to provide you with the best plan of care or course of treatment. Therefore, we ask that you please give us  2 business days to thoroughly review all your results before contacting the office for clarification. Should we see a critical lab result, you will be contacted sooner.   If You Need Anything After Your Visit  If you have any questions or concerns for your doctor, please call our main line at [phone] If no one answers, please leave a voicemail as directed  and we will return your call as soon as possible. Messages left after 4 pm will be answered the following business day.   You may also send us  a message via MyChart. We typically respond to MyChart messages within 1-2 business days.  For prescription refills, please ask your pharmacy to contact our office. Our fax number is [phone].  If you have an urgent issue when the clinic is closed that cannot wait until the next business day, you can page your doctor at the number below.    Please note that while we do our best to be available for urgent issues outside of office hours, we are not available 24/7.   If you have an urgent issue and are unable to reach us , you may choose to seek medical care at your doctor's office, retail clinic, urgent care center, or emergency room.  If you have a medical emergency, please immediately call 911 or go to the emergency department. In the event of inclement weather, please call our main line at [phone] for an update on the status of any delays or closures.  Dermatology Medication Tips: Please keep the boxes that topical medications come in in order to help keep track of the instructions about where and how to use these. Pharmacies typically print the medication instructions only on the boxes and not directly on the medication tubes.   If your medication is too expensive, please contact our office at [phone] or send us  a message through MyChart.  We are unable to tell what your co-pay for medications will be in advance as this is different depending on your insurance coverage. However, we may be able to find a substitute medication at lower cost or fill out paperwork to get insurance to cover a needed medication.   If a prior authorization is required to get your medication covered by your insurance company, please allow us  1-2 business days to complete this process.  Drug prices often vary depending on where the prescription is filled and  some pharmacies may offer cheaper prices.  The website www.goodrx.com contains coupons for medications through different pharmacies. The prices here do not account for what the cost may be with help from insurance (it may be cheaper with your insurance), but the website can give you the price if you did not use any insurance.  - You can print the associated coupon and take it with your prescription to the pharmacy.  - You may also stop by our office during regular business hours and pick up a GoodRx coupon card.  - If you need your prescription sent electronically to a different pharmacy, notify our office through [facility] or by phone at [phone]

## 2024-02-25 NOTE — Progress Notes (Signed)
   Follow-Up Visit   Subjective  Raven Mendoza is a 63 y.o. female who presents for the following: Skin Cancer Screening and Full Body Skin Exam  The patient presents for Total-Body Skin Exam (TBSE) for skin cancer screening and mole check. The patient has spots, moles and lesions to be evaluated, some may be new or changing.  The following portions of the chart were reviewed this encounter and updated as appropriate: medications, allergies, medical history  Review of Systems:  No other skin or systemic complaints except as noted in HPI or Assessment and Plan.  Objective  Well appearing patient in no apparent distress; mood and affect are within normal limits.  A full examination was performed including scalp, head, eyes, ears, nose, lips, neck, chest, axillae, abdomen, back, buttocks, bilateral upper extremities, bilateral lower extremities, hands, feet, fingers, toes, fingernails, and toenails. All findings within normal limits unless otherwise noted below.   Relevant physical exam findings are noted in the Assessment and Plan.  Right Flank Verrucous papule  Assessment & Plan   SKIN CANCER SCREENING PERFORMED TODAY.  Scar s/p Mohs for SCCIS on chest, treated on 06/14/2023, repaired with a linear closure  - Reassured that wound has healed well - Discussed that scars take up to 12 months to mature from the date of surgery - Recommend SPF 30+ to scar daily to prevent purple color - OK to start scar massage at 4-6 weeks post-op - Can consider silicone based products for scar healing   ACTINIC DAMAGE - Chronic condition, secondary to cumulative UV/sun exposure - diffuse scaly erythematous macules with underlying dyspigmentation - Recommend daily broad spectrum sunscreen SPF 30+ to sun-exposed areas, reapply every 2 hours as needed.  - Staying in the shade or wearing long sleeves, sun glasses (UVA+UVB protection) and wide brim hats (4-inch brim around the entire circumference of  the hat) are also recommended for sun protection.  - Call for new or changing lesions.  LENTIGINES, SEBORRHEIC KERATOSES, HEMANGIOMAS - Benign normal skin lesions - Benign-appearing - Call for any changes  MELANOCYTIC NEVI - Tan-brown and/or pink-flesh-colored symmetric macules and papules - Benign appearing on exam today - Observation - Call clinic for new or changing moles - Recommend daily use of broad spectrum spf 30+ sunscreen to sun-exposed areas.   HISTORY OF SQUAMOUS CELL CARCINOMA OF THE SKIN -upper chest - No evidence of recurrence today - No lymphadenopathy - Recommend regular full body skin exams - Recommend daily broad spectrum sunscreen SPF 30+ to sun-exposed areas, reapply every 2 hours as needed.  - Call if any new or changing lesions are noted between office visits  DILATED PORE -left forearm -benign - Discussed complete removal if symptomatic.   VERRUCA VULGARIS Right Flank Destruction of lesion - Right Flank Complexity: simple   Destruction method: cryotherapy   Informed consent: discussed and consent obtained   Timeout:  patient name, date of birth, surgical site, and procedure verified Lesion destroyed using liquid nitrogen: Yes   Region frozen until ice ball extended beyond lesion: Yes   Cryotherapy cycles:  1 Outcome: patient tolerated procedure well with no complications   Post-procedure details: wound care instructions given    Return in about 6 months (around 08/26/2024).  Raven Sovereign, RN, am acting as scribe for Deneise Finlay, MD .   Documentation: I have reviewed the above documentation for accuracy and completeness, and I agree with the above.  Deneise Finlay, MD

## 2024-02-28 DIAGNOSIS — Z419 Encounter for procedure for purposes other than remedying health state, unspecified: Secondary | ICD-10-CM | POA: Diagnosis not present

## 2024-03-29 DIAGNOSIS — Z419 Encounter for procedure for purposes other than remedying health state, unspecified: Secondary | ICD-10-CM | POA: Diagnosis not present

## 2024-06-04 DIAGNOSIS — E1165 Type 2 diabetes mellitus with hyperglycemia: Secondary | ICD-10-CM | POA: Diagnosis not present

## 2024-06-04 DIAGNOSIS — E890 Postprocedural hypothyroidism: Secondary | ICD-10-CM | POA: Diagnosis not present

## 2024-06-05 LAB — COMPREHENSIVE METABOLIC PANEL WITH GFR
ALT: 14 IU/L (ref 0–32)
AST: 15 IU/L (ref 0–40)
Albumin: 4.3 g/dL (ref 3.9–4.9)
Alkaline Phosphatase: 101 IU/L (ref 49–135)
BUN/Creatinine Ratio: 32 — ABNORMAL HIGH (ref 12–28)
BUN: 19 mg/dL (ref 8–27)
Bilirubin Total: 0.4 mg/dL (ref 0.0–1.2)
CO2: 23 mmol/L (ref 20–29)
Calcium: 9.5 mg/dL (ref 8.7–10.3)
Chloride: 101 mmol/L (ref 96–106)
Creatinine, Ser: 0.59 mg/dL (ref 0.57–1.00)
Globulin, Total: 2.7 g/dL (ref 1.5–4.5)
Glucose: 128 mg/dL — ABNORMAL HIGH (ref 70–99)
Potassium: 4.3 mmol/L (ref 3.5–5.2)
Sodium: 138 mmol/L (ref 134–144)
Total Protein: 7 g/dL (ref 6.0–8.5)
eGFR: 100 mL/min/1.73 (ref >59)

## 2024-06-05 LAB — T4, FREE: Free T4: 0.88 ng/dL (ref 0.82–1.77)

## 2024-06-05 LAB — TSH: TSH: 6.91 u[IU]/mL — ABNORMAL HIGH (ref 0.450–4.500)

## 2024-06-09 ENCOUNTER — Ambulatory Visit (INDEPENDENT_AMBULATORY_CARE_PROVIDER_SITE_OTHER): Admitting: "Endocrinology

## 2024-06-09 ENCOUNTER — Encounter: Payer: Self-pay | Admitting: "Endocrinology

## 2024-06-09 VITALS — BP 122/72 | HR 72 | Ht 62.0 in | Wt 222.0 lb

## 2024-06-09 DIAGNOSIS — E782 Mixed hyperlipidemia: Secondary | ICD-10-CM | POA: Diagnosis not present

## 2024-06-09 DIAGNOSIS — E890 Postprocedural hypothyroidism: Secondary | ICD-10-CM | POA: Diagnosis not present

## 2024-06-09 LAB — POCT GLYCOSYLATED HEMOGLOBIN (HGB A1C): HbA1c, POC (controlled diabetic range): 7.4 % — AB (ref 0.0–7.0)

## 2024-06-09 MED ORDER — LEVOTHYROXINE SODIUM 125 MCG PO TABS: 125.0000 ug | ORAL_TABLET | Freq: Every day | ORAL | 1 refills | Status: DC

## 2024-06-09 MED ORDER — EMPAGLIFLOZIN 10 MG PO TABS: 10.0000 mg | ORAL_TABLET | Freq: Every day | ORAL | 1 refills | Status: AC

## 2024-06-09 NOTE — Patient Instructions (Signed)

## 2024-06-09 NOTE — Progress Notes (Signed)
 06/09/2024  Endocrinology follow-up note   Subjective:    Patient ID: Raven Mendoza, female    DOB: [birth year], PCP Theophilus Andrews, Tully GRADE, MD   Past Medical History:  Diagnosis Date   Allergy to bee sting 04/20/2008   Anaphylaxis   Arthritis    Hyperthyroidism    Hypothyroidism    Obesity (BMI 35.0-39.9 without comorbidity)    Osteoarthritis    Vitamin D  deficiency    Past Surgical History:  Procedure Laterality Date   c sections  1987 and 1990   COLONOSCOPY  03/24/2004   Dr.Johnson normal excellent prep   KNEE CLOSED REDUCTION  03/26/2012   Procedure: CLOSED MANIPULATION KNEE;  Surgeon: Donnice JONETTA Car, MD;  Location: [facility];  Service: Orthopedics;  Laterality: Left;   radioactive iodine tx for thyroid   4 yrs ago,and  5 yrs ago   REVISION TOTAL HIP ARTHROPLASTY Left 2019   TOTAL HIP ARTHROPLASTY  10/24/2011   right   TOTAL KNEE ARTHROPLASTY  01/16/2012   left   TOTAL KNEE ARTHROPLASTY  03/26/2012   Procedure: TOTAL KNEE ARTHROPLASTY;  Surgeon: Donnice JONETTA Car, MD;  Location: [facility];  Service: Orthopedics;  Laterality: Right;   Social History   Socioeconomic History   Marital status: Married    Spouse name: Not on file   Number of children: Not on file   Years of education: Not on file   Highest education level: Not on file  Occupational History   Not on file  Tobacco Use   Smoking status: Never   Smokeless tobacco: Never  Vaping Use   Vaping status: Never Used  Substance and Sexual Activity   Alcohol  use: No   Drug use: No   Sexual activity: Not on file  Other Topics Concern   Not on file  Social History Narrative   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    She is active and involved with the farm.   Social Drivers of Corporate investment banker Strain: Not on file  Food Insecurity: Not on file  Transportation Needs: Not on file  Physical Activity: Not on file  Stress: Not on file  Social Connections: Not on file   Outpatient Encounter Medications  as of 06/09/2024  Medication Sig   empagliflozin  (JARDIANCE ) 10 MG TABS tablet Take 1 tablet (10 mg total) by mouth daily before breakfast.   amLODipine  (NORVASC ) 5 MG tablet Take 1 tablet (5 mg total) by mouth daily.   atorvastatin  (LIPITOR) 40 MG tablet TAKE 1 TABLET AT BEDTIME   Cholecalciferol  (VITAMIN D ) 50 MCG (2000 UT) CAPS Take 2,000 Units by mouth daily with breakfast.   levothyroxine  (SYNTHROID ) 125 MCG tablet Take 1 tablet (125 mcg total) by mouth daily before breakfast.   meloxicam  (MOBIC ) 15 MG tablet TAKE 1 TABLET DAILY AS NEEDED FOR PAIN   metFORMIN  (GLUCOPHAGE ) 1000 MG tablet TAKE 1 TABLET(1000 MG) BY MOUTH DAILY WITH BREAKFAST   [DISCONTINUED] levothyroxine  (SYNTHROID ) 112 MCG tablet TAKE 1 TABLET DAILY BEFORE BREAKFAST   No facility-administered encounter medications on file as of 06/09/2024.   ALLERGIES: Allergies  Allergen Reactions   Penicillins Swelling    Pt reports remembers hand swelling, but not sure what else swelled up. Reports reaction was as a child.    VACCINATION STATUS: Immunization History  Administered Date(s) Administered   Influenza, Seasonal, Injecte, Preservative Fre 06/07/2023   Influenza,inj,Quad PF,6+ Mos 08/01/2019, 05/17/2021, 07/17/2022   Td 08/12/2002   Tdap 12/03/2013   Zoster Recombinant(Shingrix ) 02/11/2021,  05/17/2021    HPI  63 year old female patient with medical history as above. 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  - Review of her medical records show that she did receive I-131 therapy on 2 occasions (18.2 mCi on 09/19/2005 and 26.1 mCi on 04/04/2006 due to refractory Graves' disease). - She was started on thyroid  hormone replacements which was adjusted several times over the years.  She is currently on levothyroxine  112 mcg p.o. daily before breakfast.  Her previsit thyroid  function tests are consistent with under replacement.         She reports compliance to this medication. She has no new  coplaints. -She has hyperlipidemia currently on Lipitor 20 mg p.o. nightly.    She denies heat nor cold intolerance. She denies dysphagia, shortness of breath, voice change. She has significant family history of various thyroid  dysfunctions. She denies any family history of thyroid  cancer. -She has steady weight since last visit.   She was recently diagnosed with type 2 diabetes, currently on 1000 mg of metformin  daily.  She continues to tolerate.  Her point-of-care A1c 7.4% increasing from 6.4%.   She presents with 10+ pounds of weight gain.   Review of Systems Limited as above.  Objective:    BP 122/72   Pulse 72   Ht 5' 2 (1.575 m)   Wt 222 lb (100.7 kg)   LMP 10/23/2008   BMI 40.60 kg/m   Wt Readings from Last 3 Encounters:  06/09/24 222 lb (100.7 kg)  12/05/23 210 lb 9.6 oz (95.5 kg)  06/07/23 206 lb 11.2 oz (93.8 kg)    Physical Exam    CMP ( most recent) CMP     Component Value Date/Time   NA 138 06/04/2024 1053   K 4.3 06/04/2024 1053   CL 101 06/04/2024 1053   CO2 23 06/04/2024 1053   GLUCOSE 128 (H) 06/04/2024 1053   GLUCOSE 117 (H) 07/17/2022 0755   BUN 19 06/04/2024 1053   CREATININE 0.59 06/04/2024 1053   CREATININE 0.67 08/01/2019 1431   CALCIUM  9.5 06/04/2024 1053   PROT 7.0 06/04/2024 1053   ALBUMIN 4.3 06/04/2024 1053   AST 15 06/04/2024 1053   ALT 14 06/04/2024 1053   ALKPHOS 101 06/04/2024 1053   BILITOT 0.4 06/04/2024 1053   GFRNONAA 85 11/10/2016 0822   GFRAA >89 11/10/2016 0822    Lipid Panel     Component Value Date/Time   CHOL 187 11/29/2023 1148   TRIG 51 11/29/2023 1148   HDL 97 11/29/2023 1148   CHOLHDL 1.9 11/29/2023 1148   CHOLHDL 2 12/06/2022 1012   VLDL 10.4 12/06/2022 1012   LDLCALC 80 11/29/2023 1148   Recent Results (from the past 2160 hours)  TSH     Status: Abnormal   Collection Time: 06/04/24 10:53 AM  Result Value Ref Range   TSH 6.910 (H) 0.450 - 4.500 uIU/mL  T4, free     Status: None   Collection Time:  06/04/24 10:53 AM  Result Value Ref Range   Free T4 0.88 0.82 - 1.77 ng/dL  Comprehensive metabolic panel     Status: Abnormal   Collection Time: 06/04/24 10:53 AM  Result Value Ref Range   Glucose 128 (H) 70 - 99 mg/dL   BUN 19 8 - 27 mg/dL   Creatinine, Ser 9.40 0.57 - 1.00 mg/dL   eGFR 899 >40 fO/fpw/8.26   BUN/Creatinine Ratio 32 (H) 12 - 28   Sodium 138 134 -  144 mmol/L   Potassium 4.3 3.5 - 5.2 mmol/L   Chloride 101 96 - 106 mmol/L   CO2 23 20 - 29 mmol/L   Calcium  9.5 8.7 - 10.3 mg/dL   Total Protein 7.0 6.0 - 8.5 g/dL   Albumin 4.3 3.9 - 4.9 g/dL   Globulin, Total 2.7 1.5 - 4.5 g/dL   Bilirubin Total 0.4 0.0 - 1.2 mg/dL   Alkaline Phosphatase 101 49 - 135 IU/L    Comment:               **Please note reference interval change**   AST 15 0 - 40 IU/L   ALT 14 0 - 32 IU/L  HgB A1c     Status: Abnormal   Collection Time: 06/09/24  4:30 PM  Result Value Ref Range   Hemoglobin A1C     HbA1c POC (<> result, manual entry)     HbA1c, POC (prediabetic range)     HbA1c, POC (controlled diabetic range) 7.4 (A) 0.0 - 7.0 %     Assessment & Plan:   1. 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  Her previsit thyroid  function tests are consistent with under replacement.  I discussed increase her levothyroxine  to 125 mcg p.o. daily before breakfast.    - We discussed about the correct intake of her thyroid  hormone, on empty stomach at fasting, with water, separated by at least 30 minutes from breakfast and other medications,  and separated by more than 4 hours from calcium , iron, multivitamins, acid reflux medications (PPIs). -Patient is made aware of the fact that thyroid  hormone replacement is needed for life, dose to be adjusted by periodic monitoring of thyroid  function tests.  - She does not have clinical goiter, no need for thyroid  imaging at this time.  2. hyperlipidemia: Her  LDL is controlled at 80.  She is advised to continue her atorvastatin  40 mg p.o. nightly.  Side effects and precautions discussed with her.  Whole food plant-based diet was discussed briefly with her.   3. Vitamin D  deficiency -Her vitamin D  is replete at 55.  She is advised to lower her vitamin D  supplement to 2000 units daily.    4) type 2 diabetes: Her point-of-care A1c 7.4% increasing from 6.5%.  She would benefit from additional intervention in addition to her metformin .  I discussed and added Jardiance  10 mg p.o. daily at breakfast.  Side effects and precaution discussed with her.  She is also advised to continue metformin  1000 mg p.o. daily at breakfast.  - she acknowledges that there is a room for improvement in her food and drink choices. - Suggestion is made for her to avoid simple carbohydrates  from her diet including Cakes, Sweet Desserts, Ice Cream, Soda (diet and regular), Sweet Tea, Candies, Chips, Cookies, Store Bought Juices, Alcohol  , Artificial Sweeteners,  Coffee Creamer, and Sugar-free Products, Lemonade. This will help patient to have more stable blood glucose profile and potentially avoid unintended weight gain.   - I advised patient to maintain close follow up with Theophilus Andrews, Tully GRADE, MD for primary care needs.    I spent  26  minutes in the care of the patient today including review of labs from Thyroid  Function, CMP, and other relevant labs ; imaging/biopsy records (current and previous including abstractions from other facilities); face-to-face time discussing  her lab results and symptoms, medications doses, her options of short and  long term treatment based on the latest standards of care / guidelines;   and documenting the encounter.  Raven Caldron Homan  participated in the discussions, expressed understanding, and voiced agreement with the above plans.  All questions were answered to her satisfaction. she is encouraged to contact clinic should she have any  questions or concerns prior to her return visit.    Follow up plan: Return in about 6 months (around 12/07/2024) for Fasting Labs  in AM B4 8, A1c -[place].  Ranny Earl, MD Phone: [phone]  Fax: [phone]   06/09/2024, 4:39 PM

## 2024-08-04 ENCOUNTER — Other Ambulatory Visit: Payer: Self-pay | Admitting: Internal Medicine

## 2024-08-04 DIAGNOSIS — I10 Essential (primary) hypertension: Secondary | ICD-10-CM

## 2024-09-01 ENCOUNTER — Ambulatory Visit: Admitting: Dermatology

## 2024-10-01 ENCOUNTER — Other Ambulatory Visit: Payer: Self-pay | Admitting: "Endocrinology

## 2024-10-01 DIAGNOSIS — E890 Postprocedural hypothyroidism: Secondary | ICD-10-CM

## 2024-11-12 ENCOUNTER — Ambulatory Visit: Admitting: Dermatology

## 2024-12-08 ENCOUNTER — Ambulatory Visit: Admitting: "Endocrinology
